# Patient Record
Sex: Female | Born: 1985 | Hispanic: Yes | Marital: Married | Smoking: Never smoker
Health system: Southern US, Community
[De-identification: ages and names within clinical notes are randomized; demographics above are authoritative.]

## PROBLEM LIST (undated history)

## (undated) DIAGNOSIS — D649 Anemia, unspecified: Secondary | ICD-10-CM

## (undated) DIAGNOSIS — O24419 Gestational diabetes mellitus in pregnancy, unspecified control: Secondary | ICD-10-CM

## (undated) HISTORY — PX: KNEE SURGERY: SHX244

## (undated) HISTORY — PX: OTHER SURGICAL HISTORY: SHX169

## (undated) HISTORY — DX: Anemia, unspecified: D64.9

---
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
## 2008-11-20 ENCOUNTER — Ambulatory Visit: Payer: Self-pay | Admitting: Family Medicine

## 2009-06-07 ENCOUNTER — Inpatient Hospital Stay: Payer: Self-pay | Admitting: Obstetrics and Gynecology

## 2009-06-22 IMAGING — US US OB < 14 WEEKS
1 series · 17 of 28 positions shown · non-contrast
Comparison: none

REASON FOR EXAM: CALLREPORT [id number] dates viablity  pt is 59wks 5day by
last LMP
COMMENTS:

[Series 1: us ob < 14 weeks · 17 of 51 slices shown]
[im 1/51]
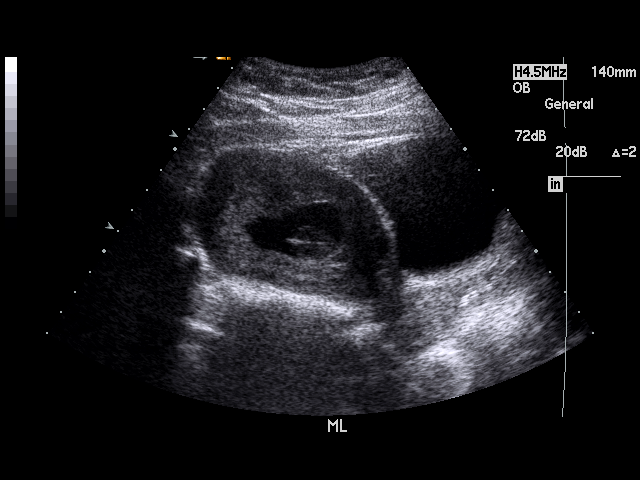
[im 4/51]
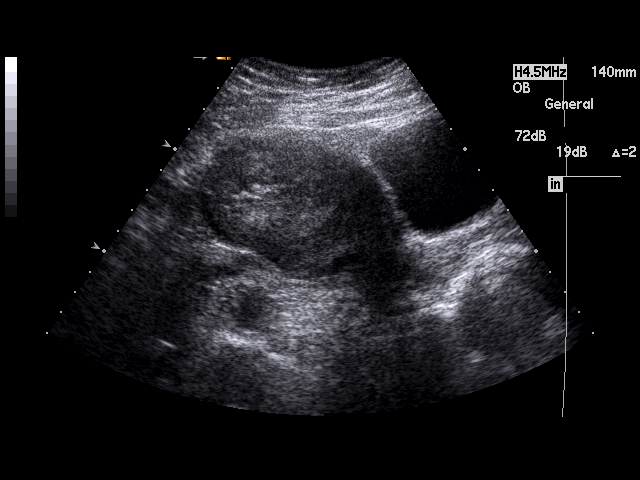
[im 8/51]
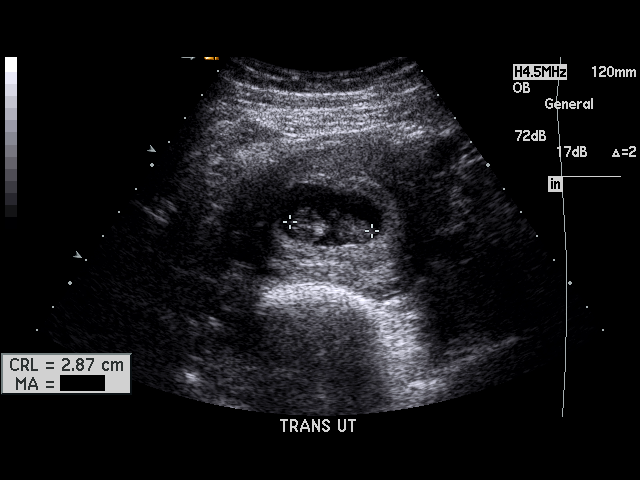
[im 10/51]
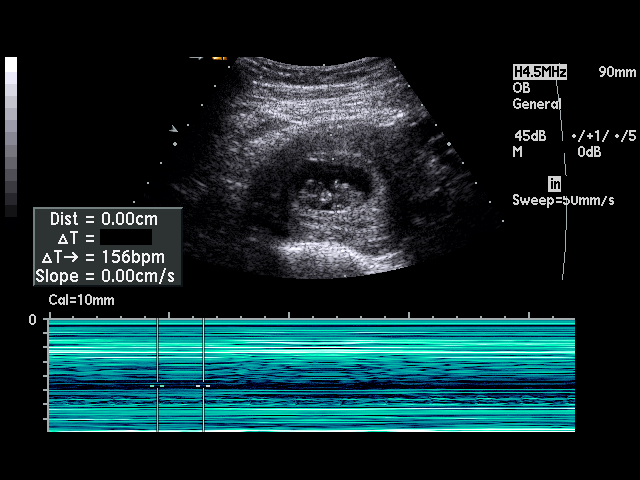
[im 13/51]
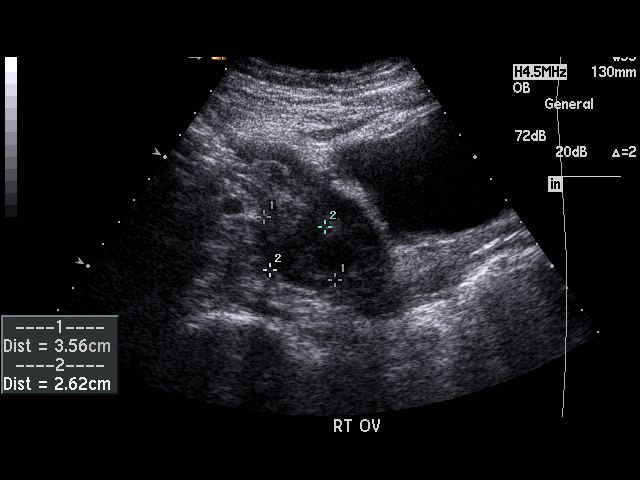
[im 17/51]
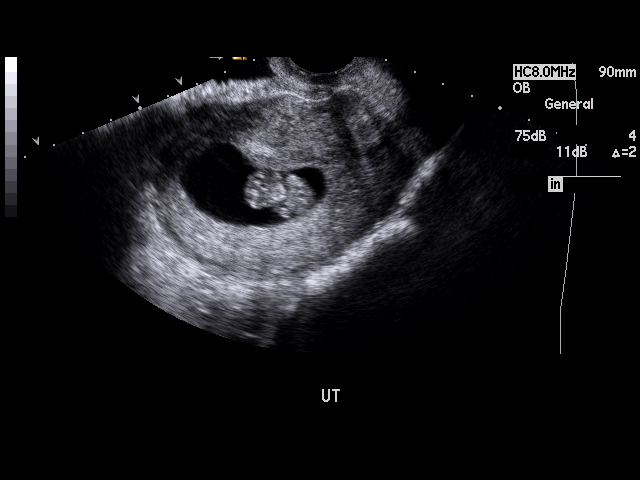
[im 19/51]
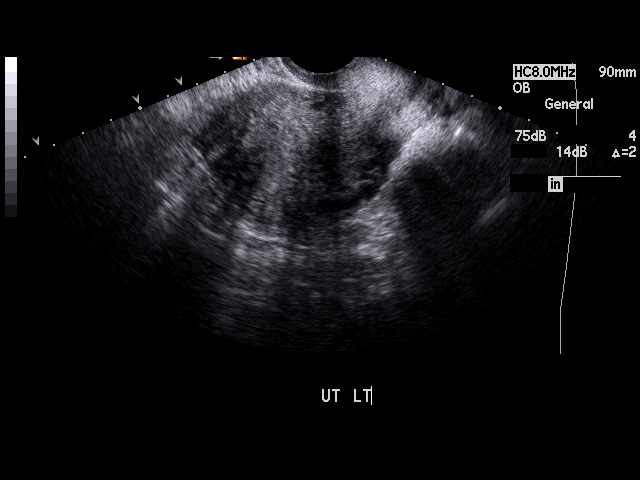
[im 23/51]
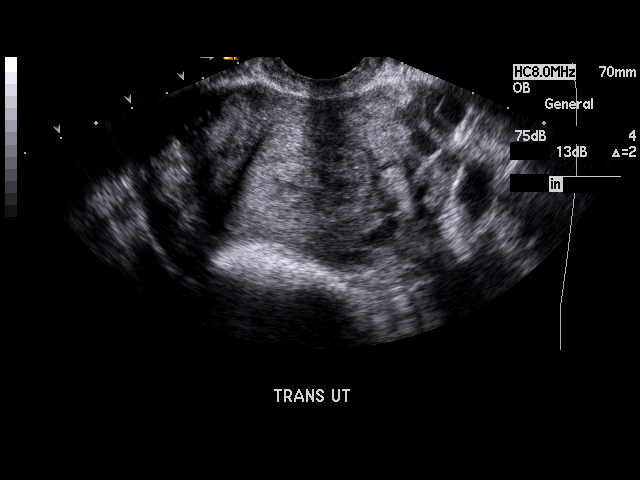
[im 26/51]
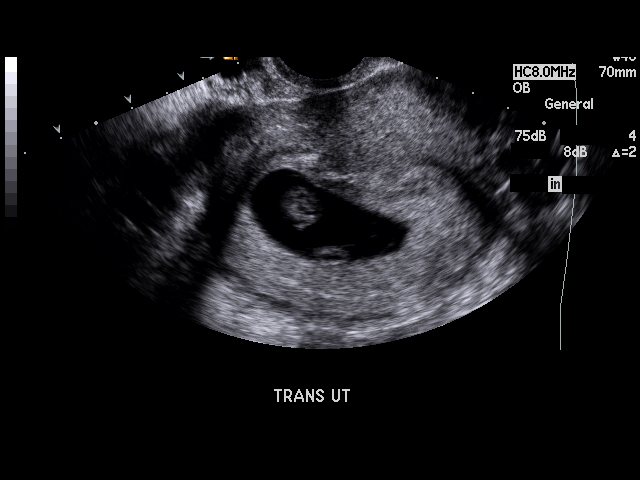
[im 28/51]
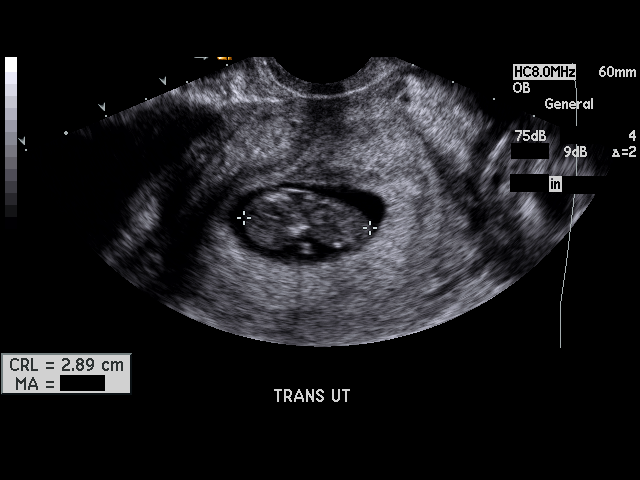
[im 32/51]
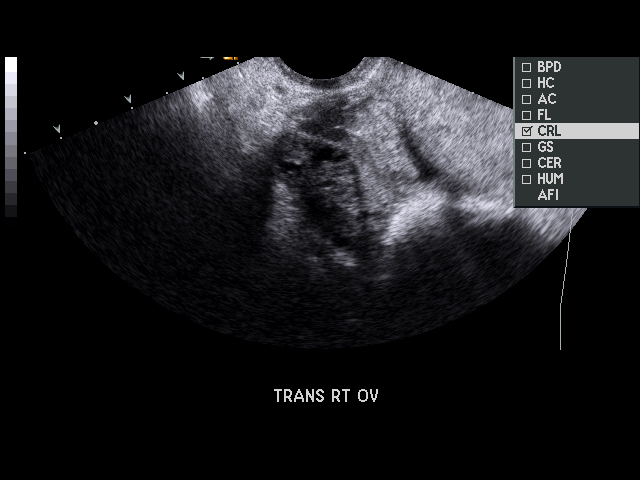
[im 34/51]
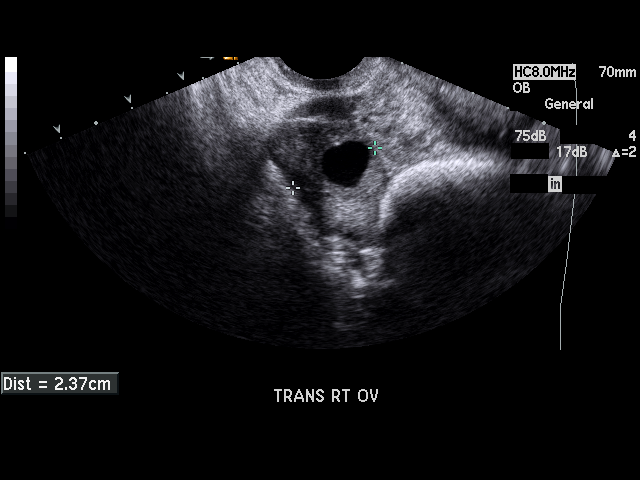
[im 38/51]
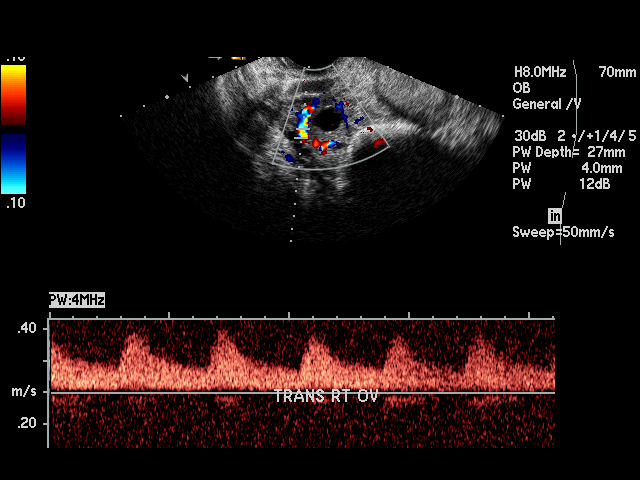
[im 41/51]
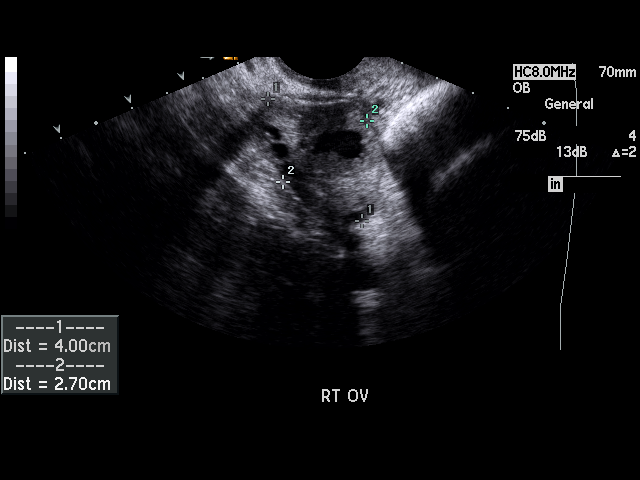
[im 43/51]
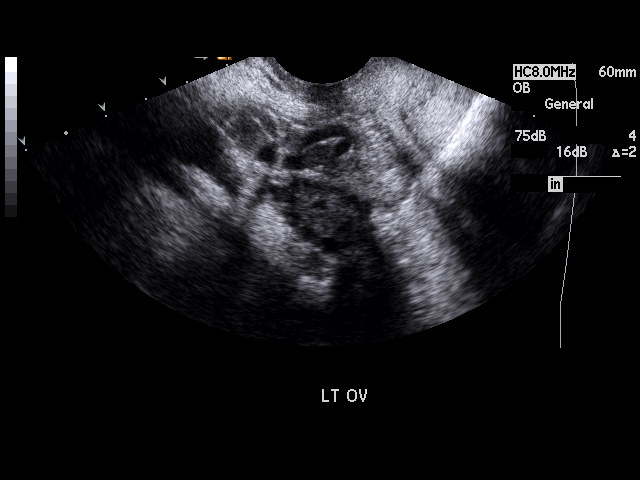
[im 47/51]
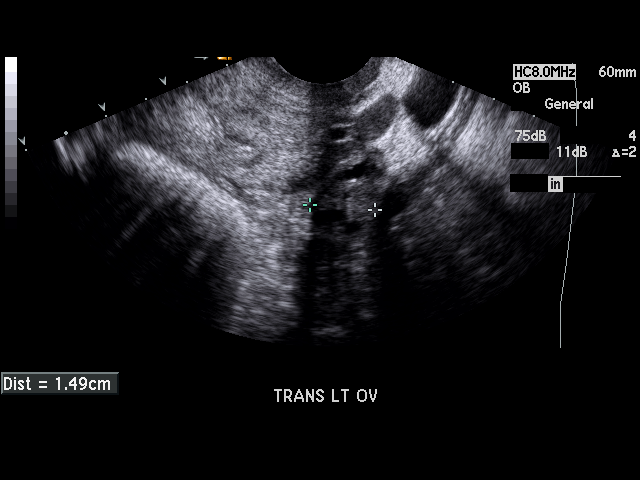
[im 51/51]
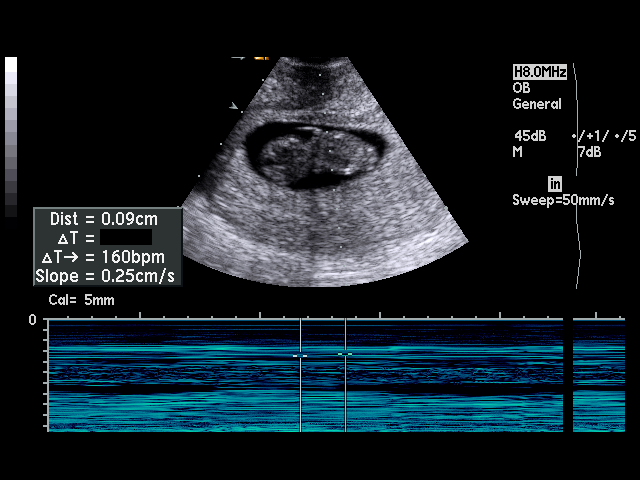

[17 of 28 positions shown; findings below may reference images not displayed]

PROCEDURE:     US  - US OB LESS THAN 14 WEEKS  - November 20, 2008  [DATE]

RESULT:     Early OB protocol ultrasound is performed utilizing
transabdominal and endovaginal scanning. The study demonstrates an
intrauterine gestation. Crown-rump length averages 2.91 cm which is
consistent with a 9 week-0 day gestation. The fetal heart rate is measured
at 156 to 160 beats per minute. The yolk sac is seen and appears
unremarkable. The RIGHT ovary contains a 2.1 x 1.8 x 1.9 cm probable corpus
luteum cyst. The ovaries are otherwise within normal limits. No adnexal
abnormality is seen.
IMPRESSION: Intrauterine fetus averaging approximately 9 weeks-0 day
gestation with an estimated delivery date of 06/20/2009.

## 2011-02-02 ENCOUNTER — Emergency Department: Payer: Self-pay | Admitting: Emergency Medicine

## 2014-01-05 ENCOUNTER — Ambulatory Visit: Payer: Self-pay | Admitting: Advanced Practice Midwife

## 2014-06-01 ENCOUNTER — Observation Stay: Payer: Self-pay

## 2014-06-06 ENCOUNTER — Inpatient Hospital Stay: Payer: Self-pay

## 2014-06-06 LAB — CBC WITH DIFFERENTIAL/PLATELET
BASOS ABS: 0 10*3/uL (ref 0.0–0.1)
Basophil %: 0.3 %
EOS PCT: 0.7 %
Eosinophil #: 0.1 10*3/uL (ref 0.0–0.7)
HCT: 36.2 % (ref 35.0–47.0)
HGB: 11.7 g/dL — ABNORMAL LOW (ref 12.0–16.0)
LYMPHS ABS: 1.6 10*3/uL (ref 1.0–3.6)
Lymphocyte %: 21.5 %
MCH: 27.3 pg (ref 26.0–34.0)
MCHC: 32.3 g/dL (ref 32.0–36.0)
MCV: 84 fL (ref 80–100)
MONOS PCT: 7.9 %
Monocyte #: 0.6 x10 3/mm (ref 0.2–0.9)
Neutrophil #: 5.3 10*3/uL (ref 1.4–6.5)
Neutrophil %: 69.6 %
Platelet: 281 10*3/uL (ref 150–440)
RBC: 4.29 10*6/uL (ref 3.80–5.20)
RDW: 13.9 % (ref 11.5–14.5)
WBC: 7.6 10*3/uL (ref 3.6–11.0)

## 2014-06-07 LAB — HEMOGLOBIN: HGB: 11.1 g/dL — ABNORMAL LOW (ref 12.0–16.0)

## 2015-01-19 NOTE — H&P (Signed)
L&D Evaluation:  History:  HPI 29 yo G3P2 at 39+5 week admitted in active labor   Patient's Medical History No Chronic Illness   Patient's Surgical History none   Medications Pre Natal Vitamins   Allergies NKDA   Social History none   Family History Non-Contributory   ROS:  ROS All systems were reviewed.  HEENT, CNS, GI, GU, Respiratory, CV, Renal and Musculoskeletal systems were found to be normal.   Exam:  Vital Signs stable   Mental Status clear   Chest clear   Heart normal sinus rhythm   Abdomen gravid, non-tender   Estimated Fetal Weight Average for gestational age   Pelvic 7 cm / c +1   Mebranes Ruptured, arom - clear   FHT normal rate with no decels   Fetal Heart Rate 130   Ucx regular   Impression:  Impression active labor   Plan:  Plan antibiotics for GBBS prophylaxis, admit anticipate SVD shortly   Electronic Signatures: Schermerhorn, Ihor Austinhomas J (MD)  (Signed 26-Sep-15 22:07)  Authored: L&D Evaluation   Last Updated: 26-Sep-15 22:07 by Suzy BouchardSchermerhorn, Thomas J (MD)

## 2017-04-19 ENCOUNTER — Other Ambulatory Visit: Payer: Self-pay | Admitting: Physician Assistant

## 2017-04-19 DIAGNOSIS — Z3481 Encounter for supervision of other normal pregnancy, first trimester: Secondary | ICD-10-CM

## 2017-04-20 LAB — OB RESULTS CONSOLE HIV ANTIBODY (ROUTINE TESTING): HIV: NONREACTIVE

## 2017-04-20 LAB — OB RESULTS CONSOLE RPR: RPR: NONREACTIVE

## 2017-04-20 LAB — OB RESULTS CONSOLE HEPATITIS B SURFACE ANTIGEN: HEP B S AG: NEGATIVE

## 2017-04-21 LAB — OB RESULTS CONSOLE GC/CHLAMYDIA
Chlamydia: NEGATIVE
GC PROBE AMP, GENITAL: NEGATIVE

## 2017-04-23 ENCOUNTER — Ambulatory Visit
Admission: RE | Admit: 2017-04-23 | Discharge: 2017-04-23 | Disposition: A | Discharge status: Home / Self Care | Payer: Self-pay | Source: Ambulatory Visit | Attending: Physician Assistant | Admitting: Physician Assistant

## 2017-04-23 DIAGNOSIS — Z3A12 12 weeks gestation of pregnancy: Secondary | ICD-10-CM | POA: Insufficient documentation

## 2017-04-23 DIAGNOSIS — Z3687 Encounter for antenatal screening for uncertain dates: Secondary | ICD-10-CM | POA: Insufficient documentation

## 2017-04-23 DIAGNOSIS — Z3481 Encounter for supervision of other normal pregnancy, first trimester: Secondary | ICD-10-CM

## 2017-05-17 ENCOUNTER — Other Ambulatory Visit: Payer: Self-pay | Admitting: Physician Assistant

## 2017-05-17 DIAGNOSIS — Z3482 Encounter for supervision of other normal pregnancy, second trimester: Secondary | ICD-10-CM

## 2017-06-07 ENCOUNTER — Ambulatory Visit: Payer: MEDICAID

## 2017-06-07 ENCOUNTER — Ambulatory Visit: Admission: RE | Admit: 2017-06-07 | Payer: Self-pay | Source: Ambulatory Visit

## 2017-06-07 ENCOUNTER — Ambulatory Visit
Admission: RE | Admit: 2017-06-07 | Discharge: 2017-06-07 | Disposition: A | Discharge status: Home / Self Care | Payer: Self-pay | Source: Ambulatory Visit | Attending: Physician Assistant | Admitting: Physician Assistant

## 2017-06-07 ENCOUNTER — Ambulatory Visit
Admit: 2017-06-07 | Discharge: 2017-06-07 | Disposition: A | Discharge status: Home / Self Care | Payer: Self-pay | Source: Ambulatory Visit | Attending: Physician Assistant | Admitting: Physician Assistant

## 2017-06-07 DIAGNOSIS — Z3482 Encounter for supervision of other normal pregnancy, second trimester: Secondary | ICD-10-CM | POA: Insufficient documentation

## 2017-06-07 DIAGNOSIS — Z3A18 18 weeks gestation of pregnancy: Secondary | ICD-10-CM | POA: Insufficient documentation

## 2017-08-06 LAB — OB RESULTS CONSOLE HIV ANTIBODY (ROUTINE TESTING): HIV: NONREACTIVE

## 2017-08-07 LAB — OB RESULTS CONSOLE RPR: RPR: NONREACTIVE

## 2017-08-14 ENCOUNTER — Encounter: Payer: Self-pay | Attending: Advanced Practice Midwife | Admitting: Dietician

## 2017-08-14 ENCOUNTER — Encounter: Payer: Self-pay | Admitting: Dietician

## 2017-08-14 DIAGNOSIS — Z3A00 Weeks of gestation of pregnancy not specified: Secondary | ICD-10-CM | POA: Insufficient documentation

## 2017-08-14 DIAGNOSIS — O24919 Unspecified diabetes mellitus in pregnancy, unspecified trimester: Secondary | ICD-10-CM | POA: Insufficient documentation

## 2017-08-14 DIAGNOSIS — Z713 Dietary counseling and surveillance: Secondary | ICD-10-CM | POA: Insufficient documentation

## 2017-08-14 NOTE — Progress Notes (Signed)
Diabetes Self-Management Education  Visit Type: First/Initial  Appt. Start Time:1430 Appt. End Time:1600  08/14/2017  Ms. Nicole EgeKaren Mccormack, identified by name and date of birth, is a 31 y.o. female with a diagnosis of Diabetes: Gestational Diabetes.   ASSESSMENT  Blood pressure 104/70, height 5' (1.524 m), weight 186 lb 6.4 oz (84.6 kg), last menstrual period 01/31/2017. Body mass index is 36.4 kg/m.  Diabetes Self-Management Education - 08/14/17 1616      Visit Information   Visit Type  First/Initial      Initial Visit   Diabetes Type  Gestational Diabetes      Health Coping   How would you rate your overall health?  Fair      Psychosocial Assessment   Patient Belief/Attitude about Diabetes  Motivated to manage diabetes    Self-care barriers  None    Self-management support  Doctor's office;Family    Other persons present  Spouse/SO    Patient Concerns  Glycemic Control;Healthy Lifestyle prevent complications    Special Needs  None    Preferred Learning Style  Visual    Learning Readiness  Ready    What is the last grade level you completed in school?  12      Pre-Education Assessment   Patient understands the diabetes disease and treatment process.  Needs Instruction    Patient understands incorporating nutritional management into lifestyle.  Needs Instruction    Patient undertands incorporating physical activity into lifestyle.  Needs Instruction    Patient understands using medications safely.  Needs Instruction    Patient understands monitoring blood glucose, interpreting and using results  Needs Instruction    Patient understands prevention, detection, and treatment of acute complications.  Needs Instruction    Patient understands prevention, detection, and treatment of chronic complications.  Needs Instruction    Patient understands how to develop strategies to address psychosocial issues.  Needs Instruction    Patient understands how to develop strategies to promote  health/change behavior.  Needs Instruction      Complications   How often do you check your blood sugar?  0 times/day (not testing)    Have you had a dilated eye exam in the past 12 months?  Yes 01-2017    Have you had a dental exam in the past 12 months?  Yes 10-2016    Are you checking your feet?  No      Dietary Intake   Breakfast  eats breakfast at 8a-10a    Snack (morning)  none    Lunch  eats lunch at 4p    Snack (afternoon)  none    Dinner  eats supper at 7p    Snack (evening)  occasionally eats fruit    Beverage(s)  drinks water4-5x/day, sweetened coffee 1x/day + diet soda 4-5x/day      Exercise   Exercise Type  ADL's      Patient Education   Previous Diabetes Education  No    Disease state   Factors that contribute to the development of diabetes;Definition of diabetes, type 1 and 2, and the diagnosis of diabetes;Explored patient's options for treatment of their diabetes    Nutrition management   Role of diet in the treatment of diabetes and the relationship between the three main macronutrients and blood glucose level;Carbohydrate counting;Food label reading, portion sizes and measuring food.    Physical activity and exercise   Helped patient identify appropriate exercises in relation to his/her diabetes, diabetes complications and other health issue.;Role of exercise  on diabetes management, blood pressure control and cardiac health.    Medications  -- discussed medications options for GDM    Monitoring  Taught/evaluated SMBG meter.;Taught/discussed recording of test results and interpretation of SMBG.;Purpose and frequency of SMBG. gave pt Contour Next meter and instructed on its use-BG 64 (ac supper); gave pt glucose tablet x1 and instructed to eat dinner as soon as possible    Acute complications  Taught treatment of hypoglycemia - the 15 rule.;Discussed and identified patients' treatment of hyperglycemia.    Chronic complications  Relationship between chronic complications and  blood glucose control    Psychosocial adjustment  Role of stress on diabetes;Worked with patient to identify barriers to care and solutions;Identified and addressed patients feelings and concerns about diabetes    Preconception care  Pregnancy and GDM  Role of pre-pregnancy blood glucose control on the development of the fetus;Reviewed with patient blood glucose goals with pregnancy;Role of family planning for patients with diabetes    Personal strategies to promote health  Lifestyle issues that need to be addressed for better diabetes care;Helped patient develop diabetes management plan for (enter comment)      Outcomes   Expected Outcomes  Demonstrated interest in learning. Expect positive outcomes       Individualized Plan for Diabetes Self-Management Training:   Learning Objective:  Patient will have a greater understanding of diabetes self-management. Patient education plan is to attend individual and/or group sessions per assessed needs and concerns.   Plan:   There are no Patient Instructions on file for this visit.  Expected Outcomes:  Demonstrated interest in learning. Expect positive outcomes  Education material provided: General meal guidelines for healthy pregnancy, GDM booklet, viewed GDM video, Contour Next meter with 40 test strips, food list handout  If problems or questions, patient to contact team via:  [phone]  Future DSME appointment:  08-22-17 at 1;30p

## 2017-08-22 ENCOUNTER — Ambulatory Visit: Payer: Self-pay | Admitting: Dietician

## 2017-08-28 ENCOUNTER — Encounter: Payer: Self-pay | Admitting: Dietician

## 2017-08-28 NOTE — Progress Notes (Signed)
Patient has not responded to reschedule her cancelled appointment from 08/22/17. Sent discharge letter to referring provider.

## 2017-09-11 NOTE — L&D Delivery Note (Signed)
Delivery Note At 7:43 AM a viable female was delivered via Vaginal, Spontaneous (Presentation:vtx ;  ).  APGAR: 8, 9; weight  .   Placenta status:intact  .  Cord:  with the following complications:precipitous labor and delivery  . Anesthesia: none  Episiotomy:  none Lacerations:  none Suture Repair: n/a Est. Blood Loss (mL): 200cc   Mom to postpartum.  Baby to Couplet care / Skin to Skin.  Ihor Austinhomas J Schermerhorn 10/27/2017, 8:34 AM

## 2017-10-04 ENCOUNTER — Other Ambulatory Visit: Payer: Self-pay | Admitting: Physician Assistant

## 2017-10-04 DIAGNOSIS — Z3483 Encounter for supervision of other normal pregnancy, third trimester: Secondary | ICD-10-CM

## 2017-10-10 ENCOUNTER — Ambulatory Visit
Admission: RE | Admit: 2017-10-10 | Discharge: 2017-10-10 | Disposition: A | Discharge status: Home / Self Care | Payer: Self-pay | Source: Ambulatory Visit | Attending: Physician Assistant | Admitting: Physician Assistant

## 2017-10-10 DIAGNOSIS — Z3A36 36 weeks gestation of pregnancy: Secondary | ICD-10-CM | POA: Insufficient documentation

## 2017-10-10 DIAGNOSIS — O09293 Supervision of pregnancy with other poor reproductive or obstetric history, third trimester: Secondary | ICD-10-CM | POA: Insufficient documentation

## 2017-10-10 DIAGNOSIS — O24419 Gestational diabetes mellitus in pregnancy, unspecified control: Secondary | ICD-10-CM | POA: Insufficient documentation

## 2017-10-10 DIAGNOSIS — Z3483 Encounter for supervision of other normal pregnancy, third trimester: Secondary | ICD-10-CM

## 2017-10-10 DIAGNOSIS — Z3689 Encounter for other specified antenatal screening: Secondary | ICD-10-CM | POA: Insufficient documentation

## 2017-10-11 LAB — OB RESULTS CONSOLE GBS: GBS: POSITIVE

## 2017-10-27 ENCOUNTER — Other Ambulatory Visit: Payer: Self-pay

## 2017-10-27 ENCOUNTER — Inpatient Hospital Stay
Admission: EM | Admit: 2017-10-27 | Discharge: 2017-10-29 | DRG: 807 | Disposition: A | Discharge status: Home / Self Care | Payer: Medicaid Other | Attending: Obstetrics & Gynecology | Admitting: Obstetrics & Gynecology

## 2017-10-27 DIAGNOSIS — O479 False labor, unspecified: Secondary | ICD-10-CM | POA: Diagnosis present

## 2017-10-27 DIAGNOSIS — O99824 Streptococcus B carrier state complicating childbirth: Secondary | ICD-10-CM | POA: Diagnosis present

## 2017-10-27 DIAGNOSIS — Z3483 Encounter for supervision of other normal pregnancy, third trimester: Secondary | ICD-10-CM | POA: Diagnosis present

## 2017-10-27 DIAGNOSIS — Z3A38 38 weeks gestation of pregnancy: Secondary | ICD-10-CM | POA: Diagnosis not present

## 2017-10-27 DIAGNOSIS — O24420 Gestational diabetes mellitus in childbirth, diet controlled: Principal | ICD-10-CM | POA: Diagnosis present

## 2017-10-27 HISTORY — DX: Gestational diabetes mellitus in pregnancy, unspecified control: O24.419

## 2017-10-27 LAB — CBC
HCT: 34.2 % — ABNORMAL LOW (ref 35.0–47.0)
HEMOGLOBIN: 11.5 g/dL — AB (ref 12.0–16.0)
MCH: 27.7 pg (ref 26.0–34.0)
MCHC: 33.7 g/dL (ref 32.0–36.0)
MCV: 82.4 fL (ref 80.0–100.0)
Platelets: 357 10*3/uL (ref 150–440)
RBC: 4.15 MIL/uL (ref 3.80–5.20)
RDW: 14.3 % (ref 11.5–14.5)
WBC: 8.7 10*3/uL (ref 3.6–11.0)

## 2017-10-27 LAB — TYPE AND SCREEN
ABO/RH(D): B POS
ANTIBODY SCREEN: NEGATIVE

## 2017-10-27 MED ORDER — BENZOCAINE-MENTHOL 20-0.5 % EX AERO
INHALATION_SPRAY | CUTANEOUS | Status: AC
  Administered 2017-10-27: 1 via TOPICAL
  Filled 2017-10-27: qty 56

## 2017-10-27 MED ORDER — PRENATAL MULTIVITAMIN CH
1.0000 | ORAL_TABLET | Freq: Every day | ORAL | Status: DC
  Administered 2017-10-27 – 2017-10-29 (×3): 1 via ORAL
  Filled 2017-10-27 (×3): qty 1

## 2017-10-27 MED ORDER — LACTATED RINGERS IV SOLN: 500.0000 mL | INTRAVENOUS | Status: DC | PRN

## 2017-10-27 MED ORDER — COCONUT OIL OIL
1.0000 "application " | TOPICAL_OIL | Status: DC | PRN
  Filled 2017-10-27: qty 120

## 2017-10-27 MED ORDER — ACETAMINOPHEN 325 MG PO TABS
650.0000 mg | ORAL_TABLET | ORAL | Status: DC | PRN
Start: 2017-10-27 — End: 2017-10-27
  Administered 2017-10-27 (×2): 650 mg via ORAL
  Filled 2017-10-27: qty 2

## 2017-10-27 MED ORDER — OXYTOCIN 40 UNITS IN LACTATED RINGERS INFUSION - SIMPLE MED
2.5000 [IU]/h | INTRAVENOUS | Status: DC
  Administered 2017-10-27: 2.5 [IU]/h via INTRAVENOUS

## 2017-10-27 MED ORDER — MEASLES, MUMPS & RUBELLA VAC ~~LOC~~ INJ
0.5000 mL | INJECTION | Freq: Once | SUBCUTANEOUS | Status: DC
  Filled 2017-10-27: qty 0.5

## 2017-10-27 MED ORDER — IBUPROFEN 600 MG PO TABS
600.0000 mg | ORAL_TABLET | Freq: Four times a day (QID) | ORAL | Status: DC
  Administered 2017-10-27: 600 mg via ORAL

## 2017-10-27 MED ORDER — IBUPROFEN 600 MG PO TABS
600.0000 mg | ORAL_TABLET | Freq: Four times a day (QID) | ORAL | Status: DC
  Administered 2017-10-27 – 2017-10-29 (×8): 600 mg via ORAL
  Filled 2017-10-27 (×8): qty 1

## 2017-10-27 MED ORDER — ACETAMINOPHEN 325 MG PO TABS
650.0000 mg | ORAL_TABLET | ORAL | Status: DC | PRN
  Filled 2017-10-27: qty 2

## 2017-10-27 MED ORDER — OXYTOCIN BOLUS FROM INFUSION
500.0000 mL | Freq: Once | INTRAVENOUS | Status: AC
  Administered 2017-10-27: 500 mL via INTRAVENOUS

## 2017-10-27 MED ORDER — SOD CITRATE-CITRIC ACID 500-334 MG/5ML PO SOLN
30.0000 mL | ORAL | Status: DC | PRN
Start: 2017-10-27 — End: 2017-10-27

## 2017-10-27 MED ORDER — MISOPROSTOL 200 MCG PO TABS
ORAL_TABLET | ORAL | Status: AC
  Filled 2017-10-27: qty 4

## 2017-10-27 MED ORDER — METHYLERGONOVINE MALEATE 0.2 MG/ML IJ SOLN
INTRAMUSCULAR | Status: AC
  Filled 2017-10-27: qty 1

## 2017-10-27 MED ORDER — MAGNESIUM HYDROXIDE 400 MG/5ML PO SUSP: 30.0000 mL | ORAL | Status: DC | PRN

## 2017-10-27 MED ORDER — ONDANSETRON HCL 4 MG PO TABS: 4.0000 mg | ORAL_TABLET | ORAL | Status: DC | PRN

## 2017-10-27 MED ORDER — SENNOSIDES-DOCUSATE SODIUM 8.6-50 MG PO TABS
2.0000 | ORAL_TABLET | ORAL | Status: DC
  Administered 2017-10-28 – 2017-10-29 (×2): 2 via ORAL
  Filled 2017-10-27 (×2): qty 2

## 2017-10-27 MED ORDER — ONDANSETRON HCL 4 MG/2ML IJ SOLN: 4.0000 mg | INTRAMUSCULAR | Status: DC | PRN

## 2017-10-27 MED ORDER — IBUPROFEN 600 MG PO TABS
ORAL_TABLET | ORAL | Status: AC
  Filled 2017-10-27: qty 1

## 2017-10-27 MED ORDER — OXYTOCIN 10 UNIT/ML IJ SOLN
INTRAMUSCULAR | Status: AC
  Filled 2017-10-27: qty 2

## 2017-10-27 MED ORDER — ZOLPIDEM TARTRATE 5 MG PO TABS: 5.0000 mg | ORAL_TABLET | Freq: Every evening | ORAL | Status: DC | PRN

## 2017-10-27 MED ORDER — LACTATED RINGERS IV SOLN
INTRAVENOUS | Status: DC
  Administered 2017-10-27: 08:00:00 via INTRAVENOUS

## 2017-10-27 MED ORDER — LIDOCAINE HCL (PF) 1 % IJ SOLN: 30.0000 mL | INTRAMUSCULAR | Status: DC | PRN

## 2017-10-27 MED ORDER — DIPHENHYDRAMINE HCL 25 MG PO CAPS: 25.0000 mg | ORAL_CAPSULE | Freq: Four times a day (QID) | ORAL | Status: DC | PRN

## 2017-10-27 MED ORDER — BENZOCAINE-MENTHOL 20-0.5 % EX AERO
1.0000 "application " | INHALATION_SPRAY | CUTANEOUS | Status: DC | PRN
  Administered 2017-10-27: 1 via TOPICAL

## 2017-10-27 MED ORDER — AMMONIA AROMATIC IN INHA
RESPIRATORY_TRACT | Status: AC
  Filled 2017-10-27: qty 10

## 2017-10-27 MED ORDER — SIMETHICONE 80 MG PO CHEW: 80.0000 mg | CHEWABLE_TABLET | ORAL | Status: DC | PRN

## 2017-10-27 MED ORDER — LIDOCAINE HCL (PF) 1 % IJ SOLN
INTRAMUSCULAR | Status: AC
  Filled 2017-10-27: qty 30

## 2017-10-27 MED ORDER — WITCH HAZEL-GLYCERIN EX PADS: 1.0000 "application " | MEDICATED_PAD | CUTANEOUS | Status: DC | PRN

## 2017-10-27 MED ORDER — OXYTOCIN 40 UNITS IN LACTATED RINGERS INFUSION - SIMPLE MED
INTRAVENOUS | Status: AC
  Filled 2017-10-27: qty 1000

## 2017-10-27 MED ORDER — FERROUS SULFATE 325 (65 FE) MG PO TABS
325.0000 mg | ORAL_TABLET | Freq: Two times a day (BID) | ORAL | Status: DC
  Administered 2017-10-27 – 2017-10-29 (×4): 325 mg via ORAL
  Filled 2017-10-27 (×4): qty 1

## 2017-10-27 MED ORDER — DIBUCAINE 1 % RE OINT: 1.0000 "application " | TOPICAL_OINTMENT | RECTAL | Status: DC | PRN

## 2017-10-27 NOTE — Discharge Summary (Signed)
Obstetric Discharge Summary   Patient ID: Patient Name: Nicole Livingston DOB: [birth year] MRN: [id number]  Date of Admission: 10/27/2017 Date of Delivery: 10/29/2017 (manually enter date) Delivered NF:[id number], MD Date of Discharge: 10/29/17 Primary OB:  [facility]  [id number] last menstrual period was 01/31/2017. EDC Estimated Date of Delivery: 11/04/17 Gestational Age at Delivery: [redacted]w[redacted]d   Antepartum complications:A1GDM Admitting Diagnosis: 38+6 week rapidly advancing cervix  Secondary Diagnoses: Patient Active Problem List   Diagnosis Date Noted  . Uterine contractions during pregnancy 10/27/2017  . Labor and delivery indication for care or intervention 10/27/2017    Augmentation: None Complications: None Intrapartum complications/course:  Delivery Type: spontaneous vaginal delivery, precipitous labor Anesthesia: none Placenta: sponatneous Laceration: none Episiotomy: none  Newborn Data: Live born female  Birth Weight:   APGAR: 8, 9  Newborn Delivery   Birth date/time:  10/27/2017 07:43:00 Delivery type:  Vaginal, Spontaneous         Postpartum Course  Patient had an uncomplicated postpartum course.  By time of discharge on PPD#2, her pain was controlled on oral pain medications; she had appropriate lochia and was ambulating, voiding without difficulty and tolerating regular diet.  She was deemed stable for discharge to home.       Labs: CBC Latest Ref Rng & Units 10/28/2017 10/27/2017 06/07/2014  WBC 3.6 - 11.0 K/uL 7.5 8.7 -  Hemoglobin 12.0 - 16.0 g/dL 10.9(L) 11.5(L) 11.1(L)  Hematocrit 35.0 - 47.0 % 32.1(L) 34.2(L) -  Platelets 150 - 440 K/uL 328 357 -   B POS  Physical exam:  BP 111/65 (BP Location: Left Arm)   Pulse (!) 58   Temp 97.7 F (36.5 C)   Resp 18   Ht 4\' 9"  (1.448 m)   Wt 85.3 kg (188 lb)   LMP 01/31/2017   SpO2 98%   Breastfeeding? Unknown   BMI 40.68 kg/m  General: alert and no distress Pulm: normal respiratory effort Lochia:  appropriate Abdomen: soft, NT Uterine Fundus: firm, below umbilicus  Extremities: No evidence of DVT seen on physical exam. No lower extremity edema.   Disposition: stable, discharge to home Baby Feeding: breastmilk+formula Baby Disposition: home with mom  Contraception: undecided   Prenatal Labs:     Plan:ABO, Rh:  B+ Antibody:  neg  Rubella:  Imm , Var Imm RPR:   neg HBsAg:   neg HIV:   neg GBS:   POSITIVE( untreated)   Nicole Livingston was discharged to home in good condition. Follow-up appointment at [facility] with delivery provider in 6 weeks  Discharge Instructions: Per After Visit Summary. Activity: Advance as tolerated. Pelvic rest for 6 weeks.   Diet: Regular Discharge Medications: Allergies as of 10/29/2017   No Known Allergies     Medication List    STOP taking these medications   MYLANTA PO     TAKE these medications   ibuprofen 600 MG tablet Commonly known as:  ADVIL,MOTRIN Take 1 tablet (600 mg total) by mouth every 6 (six) hours.   PRENATAL VITAMIN PO Take 1 tablet by mouth daily.   ranitidine 150 MG tablet Commonly known as:  ZANTAC Take 150 mg by mouth 2 (two) times daily.      Outpatient follow up:  Follow-up Information    Vali Capano, Ihor Austin, MD Follow up in 6 week(s).   Specialty:  Obstetrics and Gynecology Why:  pp exam   Contact information: [address] [place] [address] [phone]        Schrum,  Virgel GessMaribeth Horgan, MD .   Specialty:  Hematology and Oncology Contact information: [address] [place] [address] [phone]            Signed:  Ihor Austinhomas J Patrich Heinze

## 2017-10-27 NOTE — OB Triage Note (Signed)
Patient came in c/o of contractions that begin Wednesday (10/24/17). Over night contractions have been more intense and every 3-4 minutes apart. Reports loosing mucous plug. Denies vaginal bleeding. Decrease fetal movement. Patient rating pain 9 out of 10. Monitors placed and assessing.

## 2017-10-27 NOTE — H&P (Signed)
Gwendalyn EgeKaren Beery is a 32 y.o. female at 38+6 weeks  [facility] 12 week [facility]/s presenting for active labor and rapidly advancing cervical dilation . G4P3 with a h/o of A1GDM . A h/o prior shoulder dystocia  No past medical history on file.  PMHX : gestational diabetes Family History: no significant  Social History:  reports that  has never smoked. she has never used smokeless tobacco. She reports that she does not drink alcohol or use drugs.     Maternal Diabetes: Yes:  Diabetes Type:  Diet controlled Genetic Screening: Normal Maternal Ultrasounds/Referrals: Normal Fetal Ultrasounds or other Referrals:  None Maternal Substance Abuse:  No Significant Maternal Medications:  None Significant Maternal Lab Results:  None Other Comments:  None  ROS History Dilation: 4.5 Effacement (%): 80 Station: -1 Exam by:: H.Cahoon RN Blood pressure 109/74, pulse 71, temperature 98.2 F (36.8 C), temperature source Oral, resp. rate 16, height 4\' 9"  (1.448 m), weight 85.3 kg (188 lb), last menstrual period 01/31/2017, unknown if currently breastfeeding. Exam Physical Exam  Lungs CTA  CV RRR  adb gravid  Pelvic TJS : c/c +2  VTX  Prenatal labs: ABO, Rh:  B+ Antibody:  neg  Rubella:  Imm , Var Imm RPR:   neg HBsAg:   neg HIV:   neg GBS:   POSITIVE  Assessment/Plan: Precipitous labor moving from 4cm ---> complete within minutes .  Untreated GBS, notify peds    Ihor Austinhomas J Schermerhorn 10/27/2017, 8:20 AM

## 2017-10-28 LAB — CBC
HCT: 32.1 % — ABNORMAL LOW (ref 35.0–47.0)
HEMOGLOBIN: 10.9 g/dL — AB (ref 12.0–16.0)
MCH: 27.9 pg (ref 26.0–34.0)
MCHC: 34.1 g/dL (ref 32.0–36.0)
MCV: 82 fL (ref 80.0–100.0)
Platelets: 328 10*3/uL (ref 150–440)
RBC: 3.91 MIL/uL (ref 3.80–5.20)
RDW: 14.5 % (ref 11.5–14.5)
WBC: 7.5 10*3/uL (ref 3.6–11.0)

## 2017-10-28 NOTE — Progress Notes (Signed)
Post Partum Day 1 Subjective: no complaints  Objective: Blood pressure 112/68, pulse 62, temperature 97.7 F (36.5 C), temperature source Oral, resp. rate 18, height 4\' 9"  (1.448 m), weight 85.3 kg (188 lb), last menstrual period 01/31/2017, SpO2 99 %, unknown if currently breastfeeding.  Physical Exam:  General: alert and cooperative Lochia: appropriate Uterine Fundus: firm DVT Evaluation: No evidence of DVT seen on physical exam.  Recent Labs    10/27/17 0921 10/28/17 0520  HGB 11.5* 10.9*  HCT 34.2* 32.1*    Assessment/Plan: Plan for discharge tomorrow Precipitous delivery - GBS positive, not treated. Monitor baby per protocol - No maternal concerns   LOS: 1 day   Christeen DouglasBethany Scherrie Seneca 10/28/2017, 1:04 PM

## 2017-10-29 LAB — RPR: RPR: NONREACTIVE

## 2017-10-29 MED ORDER — IBUPROFEN 600 MG PO TABS: 600.0000 mg | ORAL_TABLET | Freq: Four times a day (QID) | ORAL | 0 refills | Status: DC

## 2017-10-29 NOTE — Progress Notes (Signed)
Both parents viewed the DVD "The Period of Purple Cry" prior to discharge home.

## 2017-10-29 NOTE — Discharge Instructions (Signed)
Vaginal Delivery, Care After °Refer to this sheet in the next few weeks. These instructions provide you with information about caring for yourself after vaginal delivery. Your health care provider may also give you more specific instructions. Your treatment has been planned according to current medical practices, but problems sometimes occur. Call your health care provider if you have any problems or questions. °What can I expect after the procedure? °After vaginal delivery, it is common to have: °· Some bleeding from your vagina. °· Soreness in your abdomen, your vagina, and the area of skin between your vaginal opening and your anus (perineum). °· Pelvic cramps. °· Fatigue. ° °Follow these instructions at home: °Medicines °· Take over-the-counter and prescription medicines only as told by your health care provider. °· If you were prescribed an antibiotic medicine, take it as told by your health care provider. Do not stop taking the antibiotic until it is finished. °Driving ° °· Do not drive or operate heavy machinery while taking prescription pain medicine. °· Do not drive for 24 hours if you received a sedative. °Lifestyle °· Do not drink alcohol. This is especially important if you are breastfeeding or taking medicine to relieve pain. °· Do not use tobacco products, including cigarettes, chewing tobacco, or e-cigarettes. If you need help quitting, ask your health care provider. °Eating and drinking °· Drink at least 8 eight-ounce glasses of water every day unless you are told not to by your health care provider. If you choose to breastfeed your baby, you may need to drink more water than this. °· Eat high-fiber foods every day. These foods may help prevent or relieve constipation. High-fiber foods include: °? Whole grain cereals and breads. °? Brown rice. °? Beans. °? Fresh fruits and vegetables. °Activity °· Return to your normal activities as told by your health care provider. Ask your health care provider  what activities are safe for you. °· Rest as much as possible. Try to rest or take a nap when your baby is sleeping. °· Do not lift anything that is heavier than your baby or 10 lb (4.5 kg) until your health care provider says that it is safe. °· Talk with your health care provider about when you can engage in sexual activity. This may depend on your: °? Risk of infection. °? Rate of healing. °? Comfort and desire to engage in sexual activity. °Vaginal Care °· If you have an episiotomy or a vaginal tear, check the area every day for signs of infection. Check for: °? More redness, swelling, or pain. °? More fluid or blood. °? Warmth. °? Pus or a bad smell. °· Do not use tampons or douches until your health care provider says this is safe. °· Watch for any blood clots that may pass from your vagina. These may look like clumps of dark red, brown, or black discharge. °General instructions °· Keep your perineum clean and dry as told by your health care provider. °· Wear loose, comfortable clothing. °· Wipe from front to back when you use the toilet. °· Ask your health care provider if you can shower or take a bath. If you had an episiotomy or a perineal tear during labor and delivery, your health care provider may tell you not to take baths for a certain length of time. °· Wear a bra that supports your breasts and fits you well. °· If possible, have someone help you with household activities and help care for your baby for at least a few days after   you leave the hospital. °· Keep all follow-up visits for you and your baby as told by your health care provider. This is important. °Contact a health care provider if: °· You have: °? Vaginal discharge that has a bad smell. °? Difficulty urinating. °? Pain when urinating. °? A sudden increase or decrease in the frequency of your bowel movements. °? More redness, swelling, or pain around your episiotomy or vaginal tear. °? More fluid or blood coming from your episiotomy or  vaginal tear. °? Pus or a bad smell coming from your episiotomy or vaginal tear. °? A fever. °? A rash. °? Little or no interest in activities you used to enjoy. °? Questions about caring for yourself or your baby. °· Your episiotomy or vaginal tear feels warm to the touch. °· Your episiotomy or vaginal tear is separating or does not appear to be healing. °· Your breasts are painful, hard, or turn red. °· You feel unusually sad or worried. °· You feel nauseous or you vomit. °· You pass large blood clots from your vagina. If you pass a blood clot from your vagina, save it to show to your health care provider. Do not flush blood clots down the toilet without having your health care provider look at them. °· You urinate more than usual. °· You are dizzy or light-headed. °· You have not breastfed at all and you have not had a menstrual period for 12 weeks after delivery. °· You have stopped breastfeeding and you have not had a menstrual period for 12 weeks after you stopped breastfeeding. °Get help right away if: °· You have: °? Pain that does not go away or does not get better with medicine. °? Chest pain. °? Difficulty breathing. °? Blurred vision or spots in your vision. °? Thoughts about hurting yourself or your baby. °· You develop pain in your abdomen or in one of your legs. °· You develop a severe headache. °· You faint. °· You bleed from your vagina so much that you fill two sanitary pads in one hour. °This information is not intended to replace advice given to you by your health care provider. Make sure you discuss any questions you have with your health care provider. °Document Released: 08/25/2000 Document Revised: 02/09/2016 Document Reviewed: 09/12/2015 °Elsevier Interactive Patient Education © 2018 [employer] ° °

## 2017-10-29 NOTE — Progress Notes (Signed)
Pt discharged home with baby via wheelchair by auxilary staff and understand all.

## 2018-01-21 IMAGING — US US OB COMP +14 WK
1 series · 14 of 17 positions shown · non-contrast
Comparison: none

CLINICAL DATA: Second trimester pregnancy.

EXAM:
OBSTETRICAL ULTRASOUND >14 WKS

[Series 1: us ob comp +14 wk · 0.23mm/px · 14 of 17 slices shown]
[im 1/17]
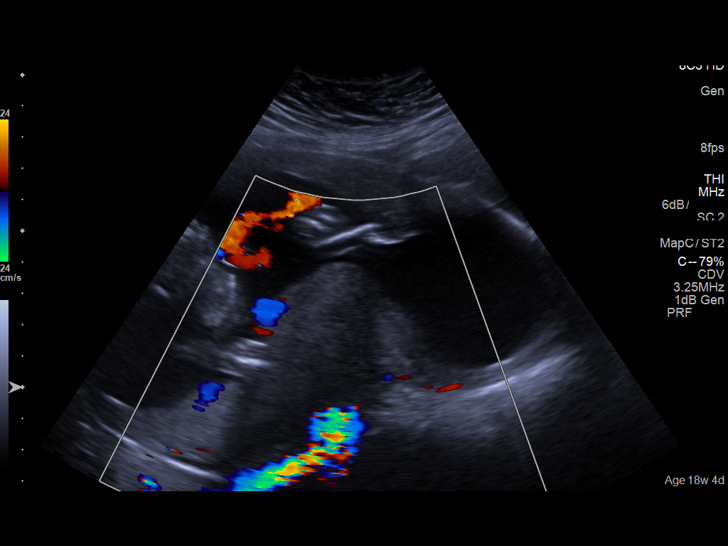
[im 2/17]
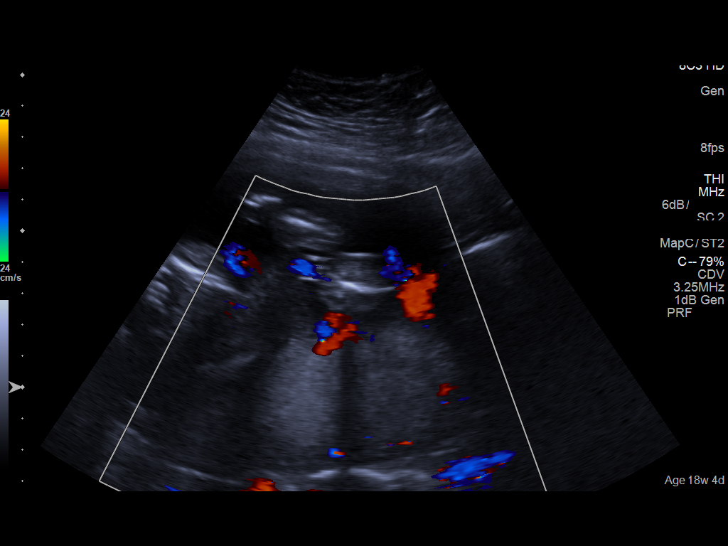
[im 4/17]
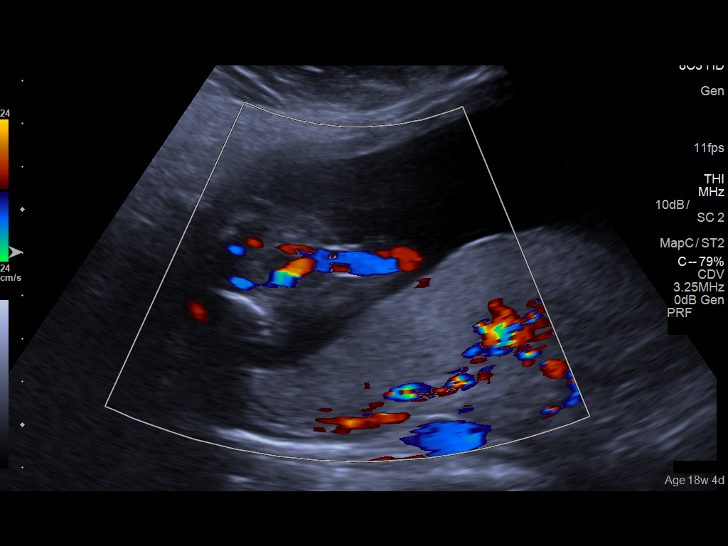
[im 5/17]
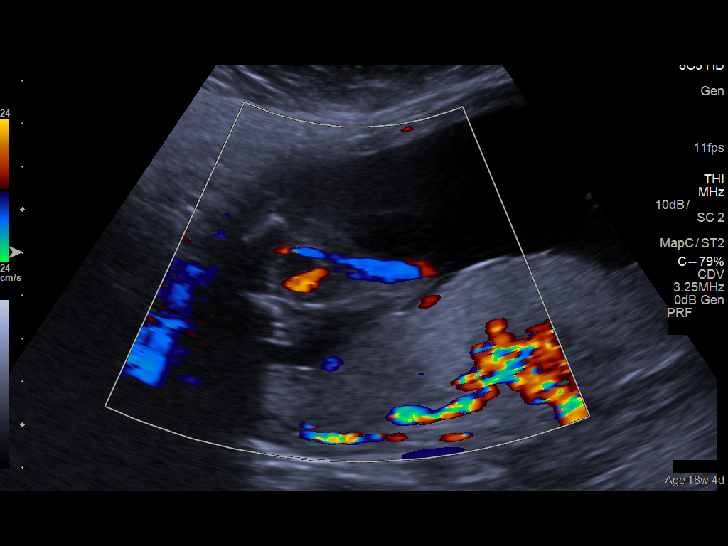
[im 6/17]
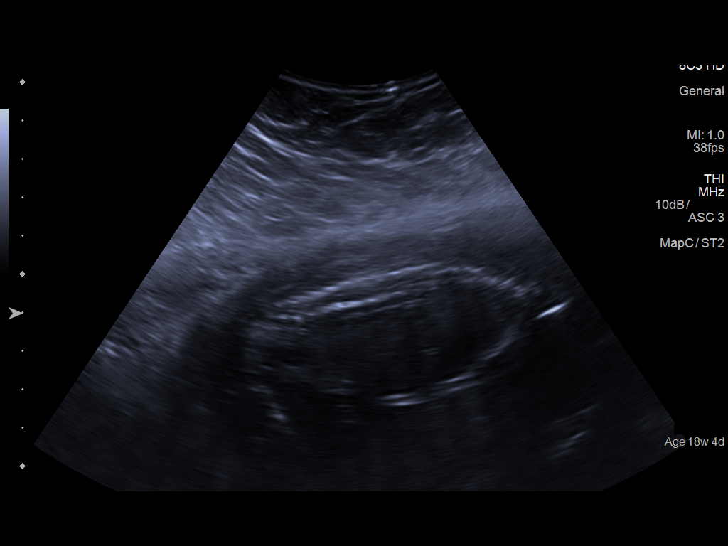
[im 7/17]
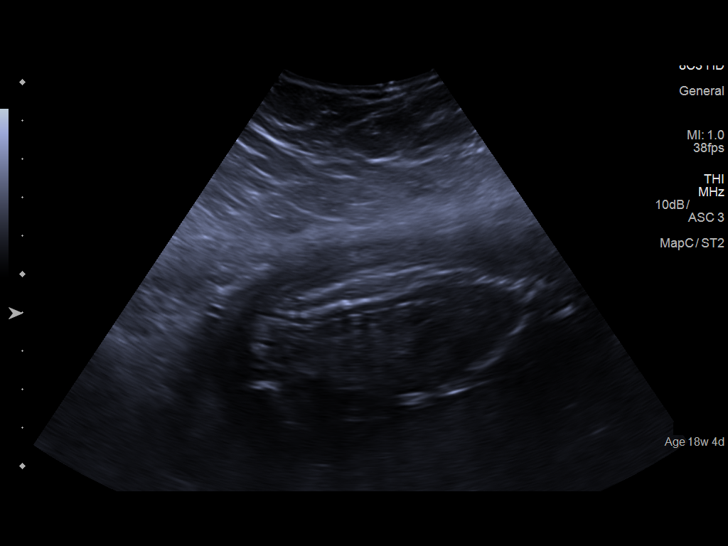
[im 8/17]
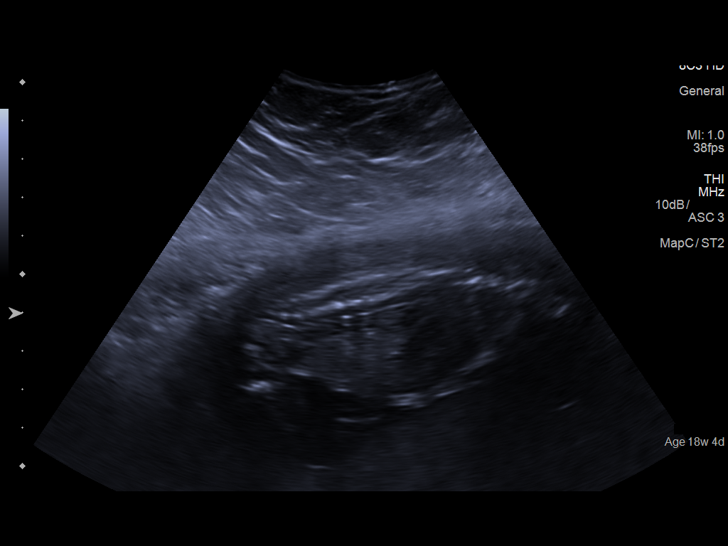
[im 10/17]
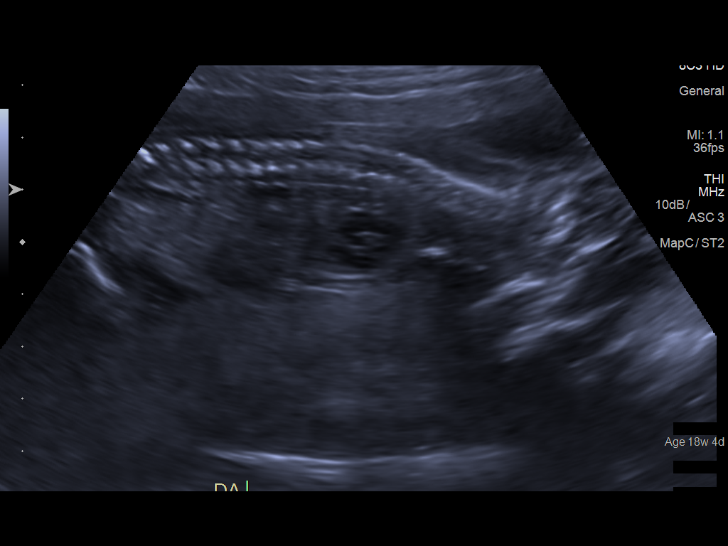
[im 11/17]
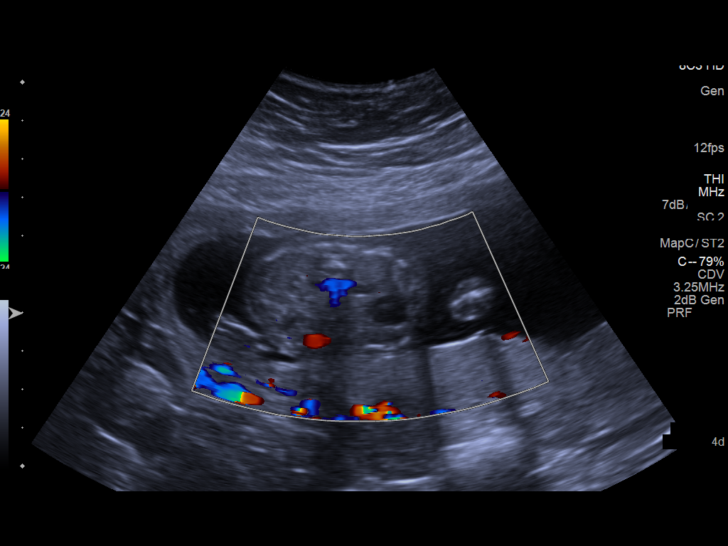
[im 12/17]
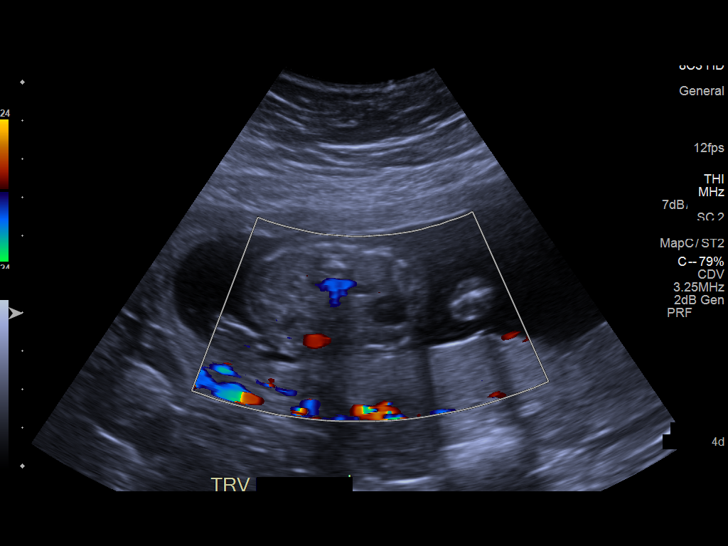
[im 13/17]
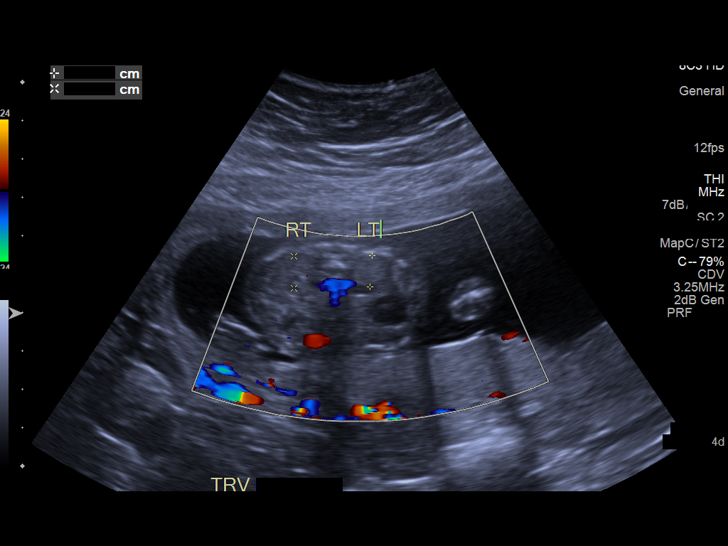
[im 14/17]
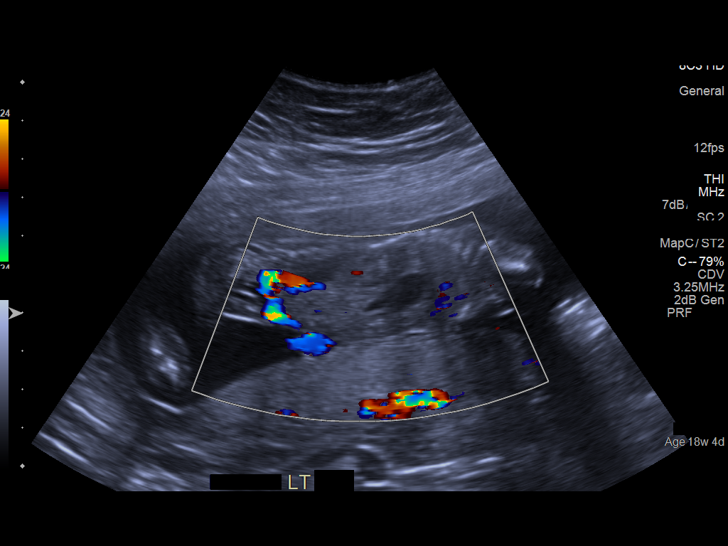
[im 16/17]
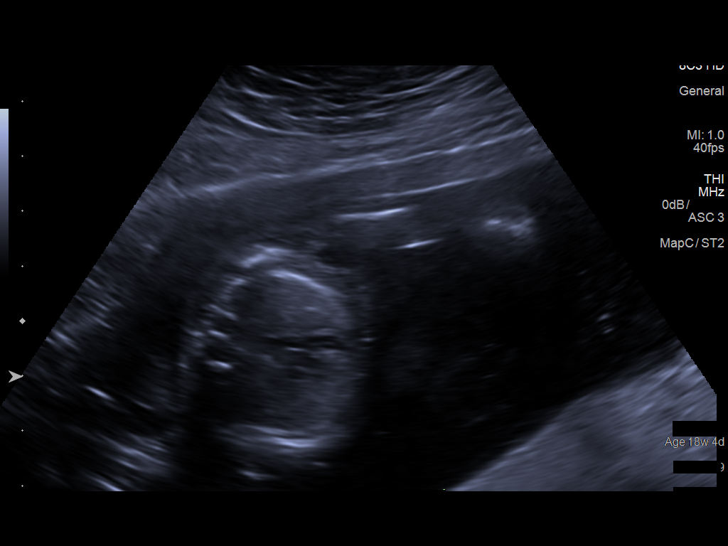
[im 17/17]
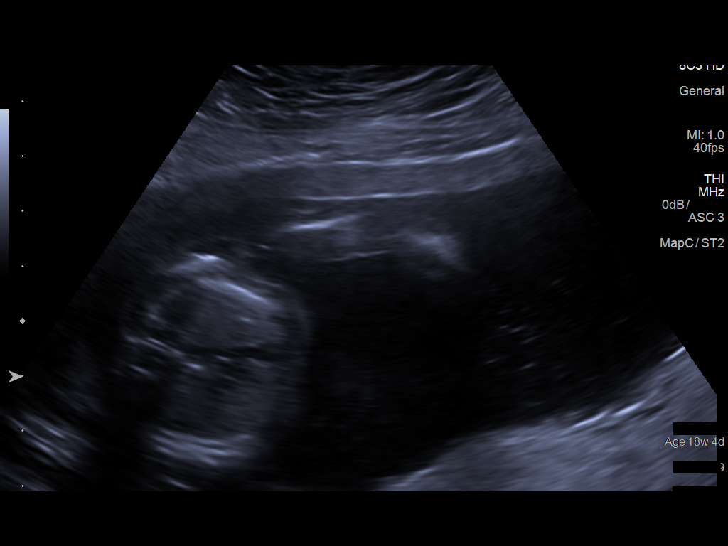

[14 of 17 positions shown; findings below may reference images not displayed]

FINDINGS: Number of Fetuses: 1

Heart Rate:  150 bpm

Movement: Present

Presentation: Cephalic

Previa: No

Placental Location: Posterior

Amniotic Fluid (Subjective): Normal

Amniotic Fluid (Objective):

Vertical pocket 4.9cm

FETAL BIOMETRY

BPD:  4.1cm 18w 3d

HC:    15.2cm  18w   2d

AC:   13.1cm  18w   4d

FL:   3.3cm  20w   1d

Current Mean GA: 18w 5d              US EDC: [DATE]/ 8581.

FETAL ANATOMY

Lateral Ventricles: Visualized

Thalami/CSP: Visualized

Posterior Fossa:  Visualized

Nuchal Region: Visualized

Upper Lip: Visualized

Spine: Limited visualization

4 Chamber Heart on Left: Visualized

LVOT: Visualized

RVOT: Visualized

Stomach on Left: Visualized

3 Vessel Cord: Visualized

Cord Insertion site: Visualized

Kidneys: Visualized

Bladder: Visualized

Extremities: Visualized

Technically difficult due to: Fetal position

Maternal Findings:

Cervix:  5.1 cm and closed
IMPRESSION: Single viable intrauterine pregnancy at 18 weeks 5 days. Variable
presentation.

## 2018-05-26 IMAGING — US US OB FOLLOW-UP
1 series · 13 of 28 positions shown · non-contrast
Comparison: none

CLINICAL DATA: Gestational diabetes. Prior history of fetal
macrosomia. Evaluate fetal growth. Current assigned gestational age
of 36 weeks 3 days.

EXAM:
OBSTETRIC 14+ WK ULTRASOUND FOLLOW-UP

[Series 1: us ob follow-up · 0.31mm/px · 13 of 59 slices shown]
[im 3/59]
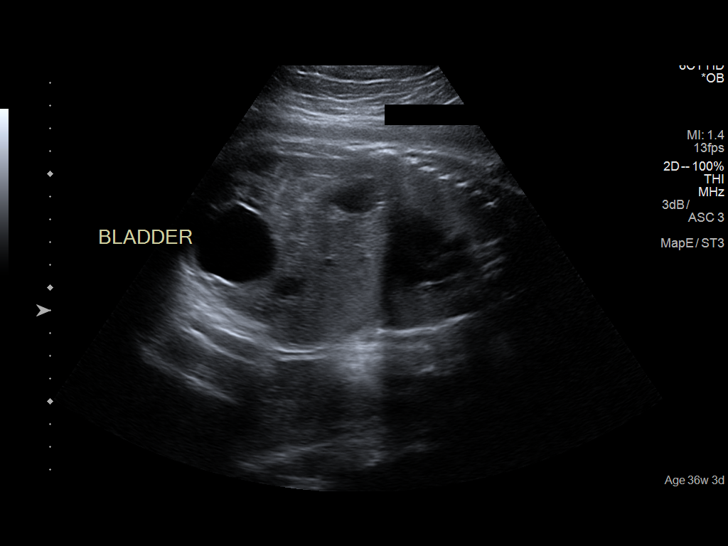
[im 7/59]
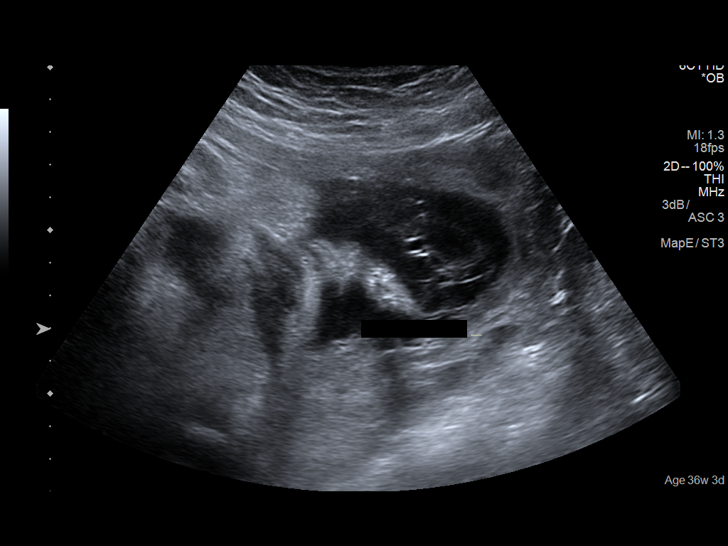
[im 11/59]
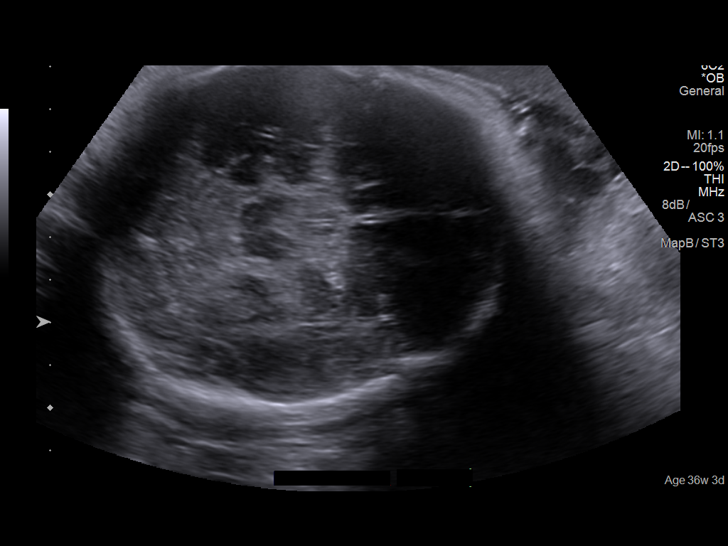
[im 16/59]
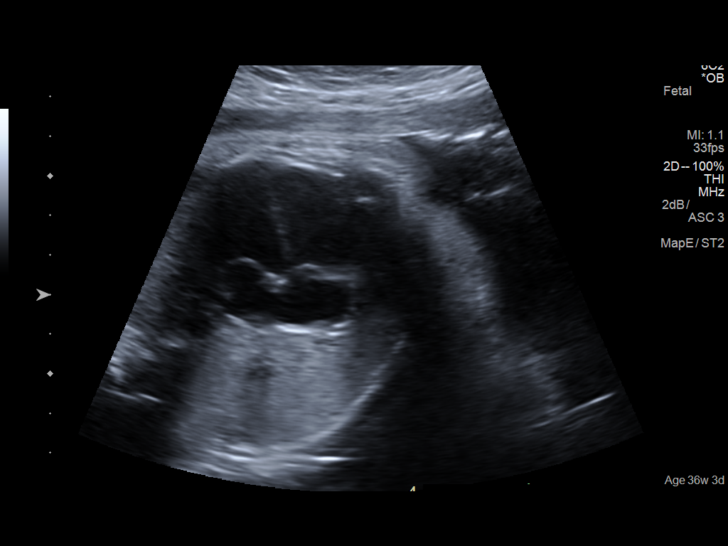
[im 20/59]
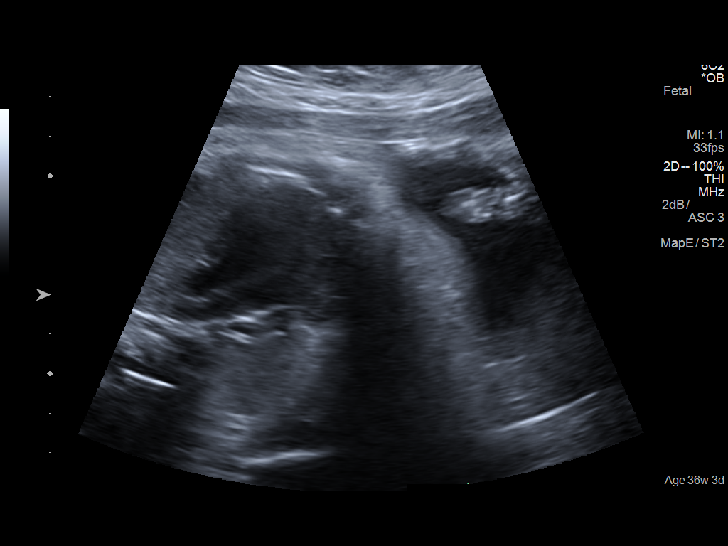
[im 24/59]
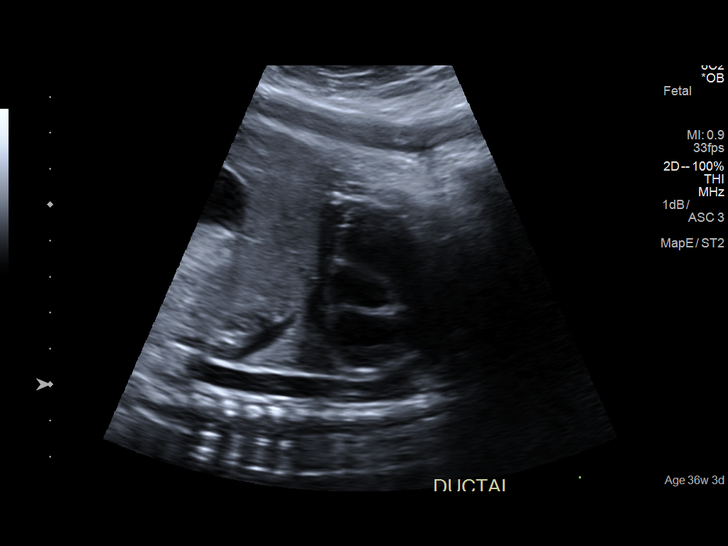
[im 31/59]
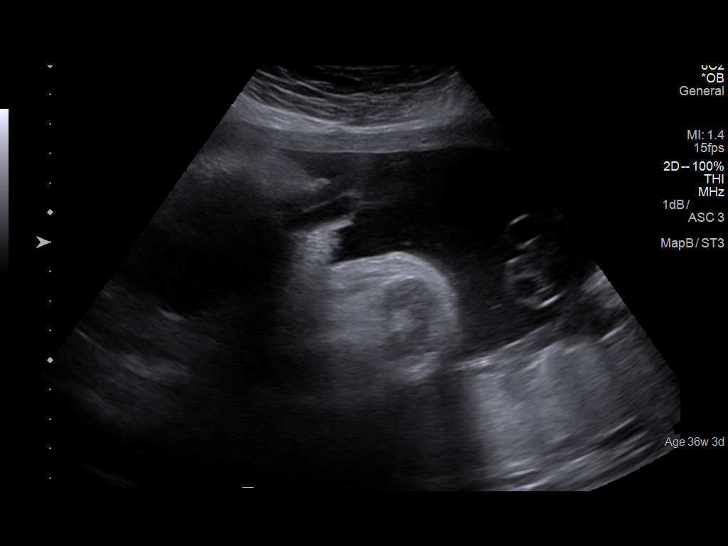
[im 35/59]
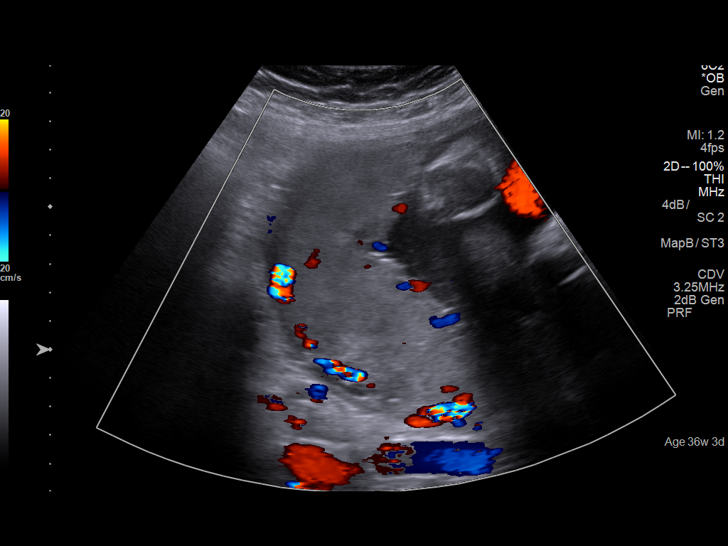
[im 39/59]
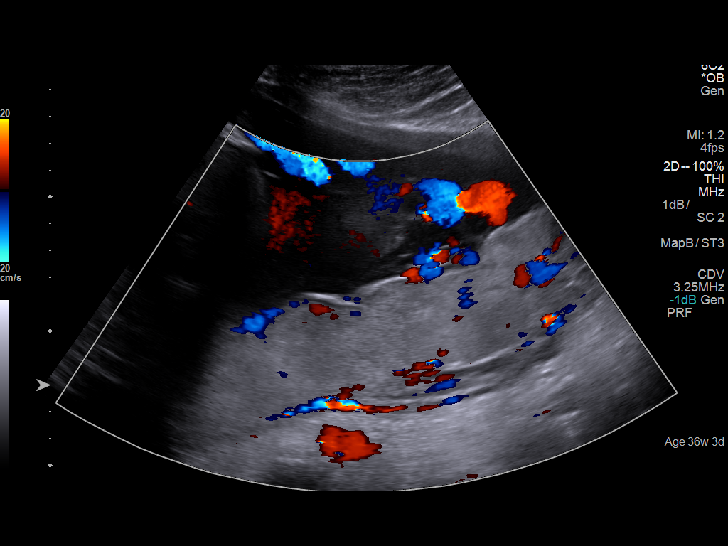
[im 43/59]
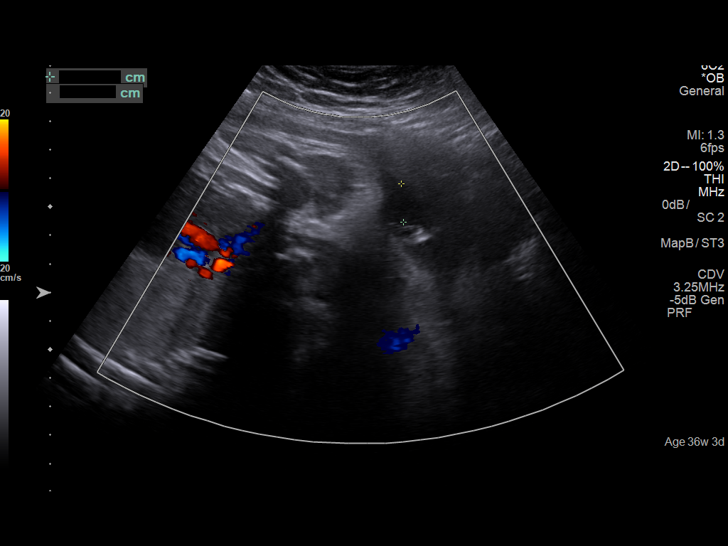
[im 48/59]
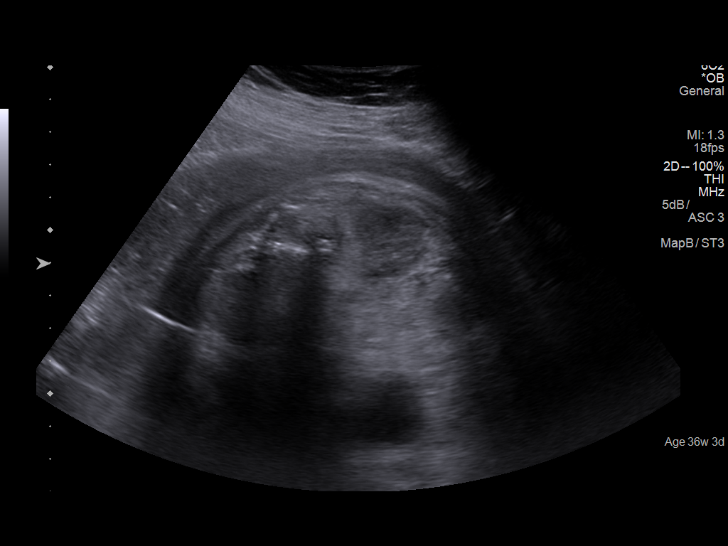
[im 52/59]
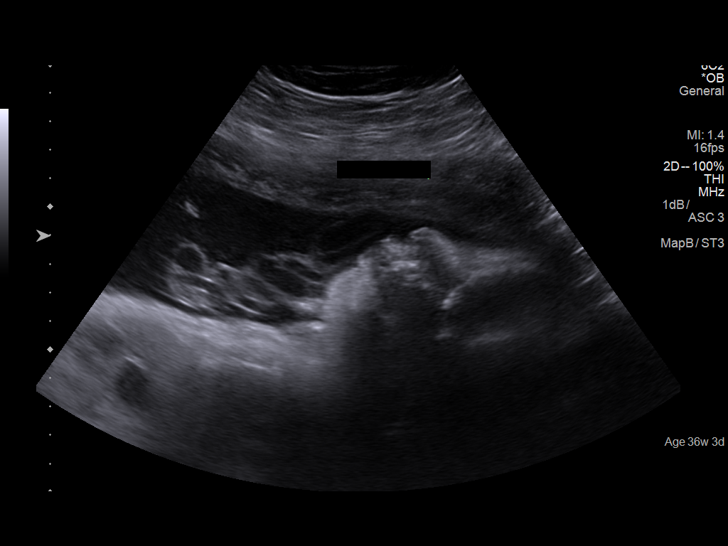
[im 56/59]
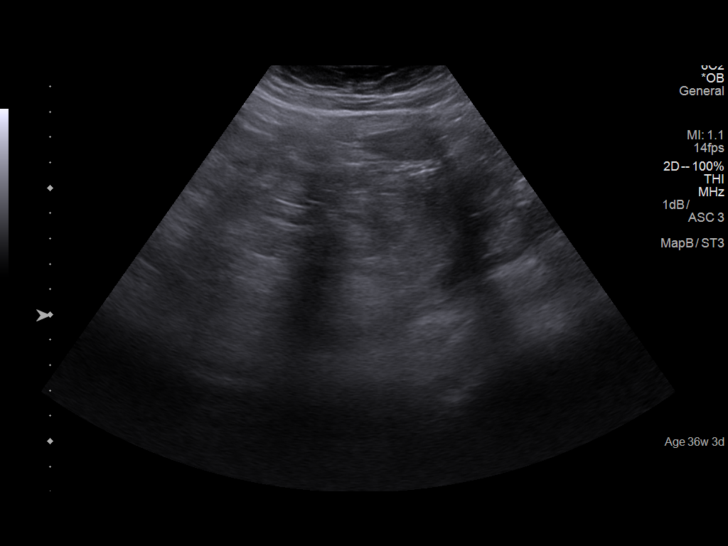

[13 of 28 positions shown; findings below may reference images not displayed]

FINDINGS: Number of Fetuses: 1

Heart Rate:  139 bpm

Movement: Yes

Presentation: Cephalic

Previa: No

Placental Location: Posterior and left lateral

Amniotic Fluid (Subjective): Within normal limits

Amniotic Fluid (Objective):

AFI 12.4 cm (5%ile= 7.7 cm, 95%= 24.9 cm for 36 wks)

FETAL BIOMETRY

BPD:  8.6cm 34w 6d

HC:    30.5cm 33w 6d

AC:   32.6cm 36w 3d

FL:   6.6cm 34w 0d

Current Mean GA: 34w 3d US EDC: 11/18/2017

Assigned GA:  36w 3d Assigned EDC:  11/04/2017

Estimated Fetal Weight:  2,662g 25%ile

FETAL ANATOMY

Lateral Ventricles: Appears normal

Thalami/CSP: Appears normal

Posterior Fossa:  Appears normal

Nuchal Region: Appears normal

Upper Lip: Appears normal

Spine: Appears normal

4 Chamber Heart on Left: Appears normal

LVOT: Appears normal

RVOT: Appears normal

Stomach on Left: Appears normal

3 Vessel Cord: Appears normal

Cord Insertion site: Appears normal

Kidneys: Appears normal

Bladder: Appears normal

Extremities: Appears normal

Technically difficult due to: Advanced gestational age

MATERNAL FINDINGS:

Cervix: Not evaluated; >34 weeks
IMPRESSION: Assigned gestational age is currently 36 weeks 3 days. Estimated
fetal weight currently at 25th percentile.

No fetal anomalies seen involving visualized anatomy noted above.

Amniotic fluid volume within normal limits, with AFI of 12.4 cm.

## 2020-07-05 ENCOUNTER — Ambulatory Visit (LOCAL_COMMUNITY_HEALTH_CENTER): Payer: Self-pay

## 2020-07-05 ENCOUNTER — Other Ambulatory Visit: Payer: Self-pay

## 2020-07-05 VITALS — BP 118/78 | Ht 59.0 in | Wt 212.0 lb

## 2020-07-05 DIAGNOSIS — Z3201 Encounter for pregnancy test, result positive: Secondary | ICD-10-CM

## 2020-07-05 LAB — PREGNANCY, URINE: Preg Test, Ur: POSITIVE — AB

## 2020-07-05 NOTE — Progress Notes (Signed)
Pt has supply of PNV but cannot keep them down right now, declined [facility] PNV. Pt desires prenatal care at [facility]; sent to preadmit.

## 2020-07-12 NOTE — Progress Notes (Signed)
Follow-up on +PT. Patient has new OB appointment at [facility] on 07/13/20.Marland KitchenBurt Knack, RN

## 2020-07-13 ENCOUNTER — Ambulatory Visit: Payer: Medicaid Other | Admitting: Advanced Practice Midwife

## 2020-07-13 ENCOUNTER — Other Ambulatory Visit: Payer: Self-pay

## 2020-07-13 VITALS — BP 119/77 | HR 83 | Temp 98.1°F | Wt 209.0 lb

## 2020-07-13 DIAGNOSIS — O0990 Supervision of high risk pregnancy, unspecified, unspecified trimester: Secondary | ICD-10-CM | POA: Insufficient documentation

## 2020-07-13 DIAGNOSIS — Z641 Problems related to multiparity: Secondary | ICD-10-CM

## 2020-07-13 DIAGNOSIS — O99212 Obesity complicating pregnancy, second trimester: Secondary | ICD-10-CM | POA: Diagnosis not present

## 2020-07-13 DIAGNOSIS — O0932 Supervision of pregnancy with insufficient antenatal care, second trimester: Secondary | ICD-10-CM | POA: Insufficient documentation

## 2020-07-13 DIAGNOSIS — Z8279 Family history of other congenital malformations, deformations and chromosomal abnormalities: Secondary | ICD-10-CM | POA: Insufficient documentation

## 2020-07-13 DIAGNOSIS — Z3482 Encounter for supervision of other normal pregnancy, second trimester: Secondary | ICD-10-CM | POA: Diagnosis not present

## 2020-07-13 DIAGNOSIS — U071 COVID-19: Secondary | ICD-10-CM | POA: Insufficient documentation

## 2020-07-13 DIAGNOSIS — R6252 Short stature (child): Secondary | ICD-10-CM

## 2020-07-13 DIAGNOSIS — O99210 Obesity complicating pregnancy, unspecified trimester: Secondary | ICD-10-CM

## 2020-07-13 DIAGNOSIS — Z8759 Personal history of other complications of pregnancy, childbirth and the puerperium: Secondary | ICD-10-CM | POA: Insufficient documentation

## 2020-07-13 LAB — HEMOGLOBIN, FINGERSTICK: Hemoglobin: 12.1 g/dL (ref 11.1–15.9)

## 2020-07-13 LAB — URINALYSIS
Bilirubin, UA: NEGATIVE
Glucose, UA: NEGATIVE
Ketones, UA: NEGATIVE
Leukocytes,UA: NEGATIVE
Nitrite, UA: NEGATIVE
Protein,UA: NEGATIVE
Specific Gravity, UA: 1.02 (ref 1.005–1.030)
Urobilinogen, Ur: 0.2 mg/dL (ref 0.2–1.0)
pH, UA: 6.5 (ref 5.0–7.5)

## 2020-07-13 LAB — WET PREP FOR TRICH, YEAST, CLUE
Trichomonas Exam: NEGATIVE
Yeast Exam: NEGATIVE

## 2020-07-13 NOTE — Progress Notes (Signed)
[facility] HEALTH DEPT [facility] [address] [place] [address] [phone]  INITIAL PRENATAL VISIT NOTE  Subjective:  Nicole Livingston is a 34 y.o.MHF nonsmoker G5P4004 (13,11,6,2) at [redacted]w[redacted]d being seen today to start prenatal care at the [facility] Department. She feels "happy" about planned pregnancy (stopped using condoms 03/2020).  34 yo employed husband of 9 years feels "happy" about pregnancy.  She is living with her husband and 4 kids.  Denies cigs, vaping, cigars, MJ.  Last ETOH 4 years ago (1 [place]).  Unsure LMP maybe 01/27/20 with +PT 05/2020 with +fetal movement.  C/o N&V 1-2x/day with last vomiting yesterday. She is currently monitored for the following issues for this high-risk pregnancy and has History of shoulder dystocia in prior pregnancy 05/2014 (7#14); History of postpartum hemorrhage 500 cc 05/2014; Grand multipara; Obesity affecting pregnancy BMI 42.2; Prenatal care, subsequent pregnancy, second trimester; Lactating mother of 4 yo; and Clinical diagnosis of COVID-19 on 03/2019 on their problem list.  Patient reports no complaints.  Contractions: Not present. Vag. Bleeding: None.  Movement: Present. Denies leaking of fluid.   Indications for ASA therapy (per uptodate) One of the following: Previous pregnancy with preeclampsia, especially early onset and with an adverse outcome No Multifetal gestation No Chronic hypertension No Type 1 or 2 diabetes mellitus No Chronic kidney disease No Autoimmune disease (antiphospholipid syndrome, systemic lupus erythematosus) No  Two or more of the following: Nulliparity No Obesity (body mass index >30 kg/m2) Yes Family history of preeclampsia in mother or sister No Age ?35 years No Sociodemographic characteristics (African American race, low socioeconomic level) No Personal risk factors (eg, previous pregnancy with low birth weight or small for gestational age infant,  previous adverse pregnancy outcome [eg, stillbirth], interval >10 years between pregnancies) No   The following portions of the patient's history were reviewed and updated as appropriate: allergies, current medications, past family history, past medical history, past social history, past surgical history and problem list. Problem list updated.  Objective:   Vitals:   07/13/20 1336  BP: 119/77  Pulse: 83  Temp: 98.1 F (36.7 C)  Weight: 209 lb (94.8 kg)    Fetal Status: Fetal Heart Rate (bpm): 160 Fundal Height: 16 cm Movement: Present  Presentation: Undeterminable   Physical Exam Vitals and nursing note reviewed.  Constitutional:      General: She is not in acute distress.    Appearance: Normal appearance. She is well-developed. She is obese.  HENT:     Head: Normocephalic and atraumatic.     Right Ear: External ear normal.     Left Ear: External ear normal.     Nose: Nose normal. No congestion or rhinorrhea.     Mouth/Throat:     Lips: Pink.     Mouth: Mucous membranes are moist.     Dentition: Normal dentition. No dental caries.     Pharynx: Oropharynx is clear. Uvula midline.  Eyes:     General: No scleral icterus.    Conjunctiva/sclera: Conjunctivae normal.  Neck:     Thyroid: No thyroid mass or thyromegaly.  Cardiovascular:     Rate and Rhythm: Normal rate.     Pulses: Normal pulses.     Comments: Extremities are warm and well perfused Pulmonary:     Effort: Pulmonary effort is normal.     Breath sounds: Normal breath sounds.  Chest:     Breasts: Breasts are symmetrical.  Right: Normal. No mass, nipple discharge or skin change.        Left: Normal. No mass, nipple discharge or skin change.       Comments: Boil that burst, erythematous, not oozing Abdominal:     Palpations: Abdomen is soft.     Tenderness: There is no abdominal tenderness.     Comments: Gravid soft without tenderness, 14-16 wk size, FHR 160  Genitourinary:    General: Normal vulva.      Exam position: Lithotomy position.     Pubic Area: No rash.      Labia:        Right: No rash.        Left: No rash.      Vagina: Vaginal discharge (white creamy leukorrhea, ph<4.5) present.     Cervix: Friability (sl friable to pap) present. No cervical motion tenderness.     Uterus: Normal. Enlarged (Gravid 14-16 wk size). Not tender.      Rectum: Normal. No external hemorrhoid.  Musculoskeletal:     Right lower leg: No edema.     Left lower leg: No edema.  Lymphadenopathy:     Upper Body:     Right upper body: No axillary adenopathy.     Left upper body: No axillary adenopathy.  Skin:    General: Skin is warm.     Capillary Refill: Capillary refill takes less than 2 seconds.  Neurological:     Mental Status: She is alert.     Assessment and Plan:  Pregnancy: G5P4004 at [redacted]w[redacted]d  1. History of shoulder dystocia in prior pregnancy 05/2014 (7#14) Alert L&D  2. History of postpartum hemorrhage 500 cc 05/2014   3. Grand multipara G5P4  4. Obesity affecting pregnancy, antepartum Too late for ASA 81 mg Counseled on total weight gain of 11-20 lbs  5. Prenatal care, subsequent pregnancy, second trimester Too late in care for FIRST screen Dating u/s ordered Last pap 04/19/17 neg with no HPV ordered  - WET PREP FOR TRICH, YEAST, CLUE - Hemoglobin, venipuncture - Urinalysis (Urine Dip) - 585277 Drug Screen - IGP, Aptima HPV - HIV Antibody (routine testing w rflx) - Prenatal profile without Varicella or Rubella - HCV Ab w Reflex to Quant PCR - Urine Culture - Chlamydia/GC NAA, Confirmation - Glucose, 1 hour gestational - Hgb A1c w/o eAG - Comprehensive metabolic panel - Protein / creatinine ratio, urine - TSH  6. Lactating mother of 4 yo Counseled to increase daily protein and may continue breastfeeding  7. Clinical diagnosis of COVID-19 on 03/2019    Discussed overview of care and coordination with inpatient delivery practices including [facility], [facility] and [facility].   Reviewed Centering pregnancy as standard of care at [facility], oriented to room and showed video. Based on EDD, plan for Cycle     Preterm labor symptoms and general obstetric precautions including but not limited to vaginal bleeding, contractions, leaking of fluid and fetal movement were reviewed in detail with the patient.  Please refer to After Visit Summary for other counseling recommendations.   Return in about 2 weeks (around 07/27/2020) for routine PNC.  No future appointments.  Alberteen Spindle, CNM

## 2020-07-13 NOTE — Progress Notes (Signed)
Presents for MH IP. Describes irregular periods and states that LMP of 01/27/20 may not be consistent with her pregnancy duration. Takes PNV but c/o nausea and vomiting. Pap smear collected today.  New OB packet given, counseled. Desires Quad screen if appropriate dating. RN to call patient to schedule Quad. Desires flu shot at next appointment. U/S referral faxed. RN Sharlyne Pacas, RN

## 2020-07-14 ENCOUNTER — Telehealth: Payer: Self-pay | Admitting: Student

## 2020-07-14 ENCOUNTER — Telehealth: Payer: Self-pay

## 2020-07-14 DIAGNOSIS — R7309 Other abnormal glucose: Secondary | ICD-10-CM | POA: Insufficient documentation

## 2020-07-14 LAB — CBC/D/PLT+RPR+RH+ABO+AB SCR
Antibody Screen: NEGATIVE
Basophils Absolute: 0 10*3/uL (ref 0.0–0.2)
Basos: 0 %
EOS (ABSOLUTE): 0.1 10*3/uL (ref 0.0–0.4)
Eos: 1 %
Hematocrit: 36.2 % (ref 34.0–46.6)
Hemoglobin: 12.1 g/dL (ref 11.1–15.9)
Hepatitis B Surface Ag: NEGATIVE
Immature Grans (Abs): 0 10*3/uL (ref 0.0–0.1)
Immature Granulocytes: 0 %
Lymphocytes Absolute: 1.7 10*3/uL (ref 0.7–3.1)
Lymphs: 23 %
MCH: 28.8 pg (ref 26.6–33.0)
MCHC: 33.4 g/dL (ref 31.5–35.7)
MCV: 86 fL (ref 79–97)
Monocytes Absolute: 0.4 10*3/uL (ref 0.1–0.9)
Monocytes: 5 %
Neutrophils Absolute: 5 10*3/uL (ref 1.4–7.0)
Neutrophils: 71 %
Platelets: 348 10*3/uL (ref 150–450)
RBC: 4.2 x10E6/uL (ref 3.77–5.28)
RDW: 13 % (ref 11.7–15.4)
RPR Ser Ql: NONREACTIVE
Rh Factor: POSITIVE
WBC: 7.2 10*3/uL (ref 3.4–10.8)

## 2020-07-14 LAB — PROTEIN / CREATININE RATIO, URINE
Creatinine, Urine: 78.3 mg/dL
Protein, Ur: 11.3 mg/dL
Protein/Creat Ratio: 144 mg/g creat (ref 0–200)

## 2020-07-14 LAB — COMPREHENSIVE METABOLIC PANEL
ALT: 18 IU/L (ref 0–32)
AST: 13 IU/L (ref 0–40)
Albumin/Globulin Ratio: 1.4 (ref 1.2–2.2)
Albumin: 4.2 g/dL (ref 3.8–4.8)
Alkaline Phosphatase: 70 IU/L (ref 44–121)
BUN/Creatinine Ratio: 15 (ref 9–23)
BUN: 8 mg/dL (ref 6–20)
Bilirubin Total: <0.2 mg/dL (ref 0.0–1.2)
CO2: 22 mmol/L (ref 20–29)
Calcium: 9.1 mg/dL (ref 8.7–10.2)
Chloride: 100 mmol/L (ref 96–106)
Creatinine, Ser: 0.53 mg/dL — ABNORMAL LOW (ref 0.57–1.00)
GFR calc Af Amer: 145 mL/min/{1.73_m2} (ref >59)
GFR calc non Af Amer: 126 mL/min/{1.73_m2} (ref >59)
Globulin, Total: 3 g/dL (ref 1.5–4.5)
Glucose: 155 mg/dL — ABNORMAL HIGH (ref 65–99)
Potassium: 4.1 mmol/L (ref 3.5–5.2)
Sodium: 132 mmol/L — ABNORMAL LOW (ref 134–144)
Total Protein: 7.2 g/dL (ref 6.0–8.5)

## 2020-07-14 LAB — HCV INTERPRETATION

## 2020-07-14 LAB — GLUCOSE, 1 HOUR GESTATIONAL: Gestational Diabetes Screen: 146 mg/dL — ABNORMAL HIGH (ref 65–139)

## 2020-07-14 LAB — HCV AB W REFLEX TO QUANT PCR: HCV Ab: 0.1 s/co ratio (ref 0.0–0.9)

## 2020-07-14 LAB — HGB A1C W/O EAG: Hgb A1c MFr Bld: 6.2 % — ABNORMAL HIGH (ref 4.8–5.6)

## 2020-07-14 LAB — TSH: TSH: 1.17 u[IU]/mL (ref 0.450–4.500)

## 2020-07-14 LAB — HIV ANTIBODY (ROUTINE TESTING W REFLEX): HIV Screen 4th Generation wRfx: NONREACTIVE

## 2020-07-14 NOTE — Telephone Encounter (Signed)
Patient returned RN call. Patient counseled on need for 3 hour gtt due to elevated 1 hour gtt (146). Patient was scheduled for Quad screen on Monday afternoon, appointment rescheduled for 07/19/20 at 0800. Patient will have 3 hour gtt and quad screen at that time. Fasting protocol explained to patient, questions answered and patient agrees to plan. Burt Knack, RN

## 2020-07-14 NOTE — Telephone Encounter (Signed)
Attempt TC to patient to schedule Quad Screen lab apt in RN clinic.Unable to leave message. Based on LMP from, pt chart lists patient at 27 wks.  Patient measured to be between 16-18 weeks  on 07/14/20 at IP appointment. Next RV is on 11/30, in which pt may be out of window to received screen. Sharlyne Pacas, RN

## 2020-07-15 ENCOUNTER — Other Ambulatory Visit: Payer: Self-pay | Admitting: Advanced Practice Midwife

## 2020-07-15 DIAGNOSIS — Z3482 Encounter for supervision of other normal pregnancy, second trimester: Secondary | ICD-10-CM

## 2020-07-15 LAB — 789231 7+OXYCODONE-BUND
Amphetamines, Urine: NEGATIVE ng/mL
BENZODIAZ UR QL: NEGATIVE ng/mL
Barbiturate screen, urine: NEGATIVE ng/mL
Cannabinoid Quant, Ur: NEGATIVE ng/mL
Cocaine (Metab.): NEGATIVE ng/mL
OPIATE SCREEN URINE: NEGATIVE ng/mL
Oxycodone/Oxymorphone, Urine: NEGATIVE ng/mL
PCP Quant, Ur: NEGATIVE ng/mL

## 2020-07-15 LAB — URINE CULTURE

## 2020-07-16 LAB — IGP, APTIMA HPV
HPV Aptima: NEGATIVE
PAP Smear Comment: 0

## 2020-07-16 LAB — CHLAMYDIA/GC NAA, CONFIRMATION
Chlamydia trachomatis, NAA: NEGATIVE
Neisseria gonorrhoeae, NAA: NEGATIVE

## 2020-07-19 ENCOUNTER — Other Ambulatory Visit: Payer: Self-pay

## 2020-07-19 ENCOUNTER — Other Ambulatory Visit: Payer: Medicaid Other

## 2020-07-19 DIAGNOSIS — O99210 Obesity complicating pregnancy, unspecified trimester: Secondary | ICD-10-CM

## 2020-07-19 DIAGNOSIS — Z3482 Encounter for supervision of other normal pregnancy, second trimester: Secondary | ICD-10-CM

## 2020-07-19 DIAGNOSIS — R7309 Other abnormal glucose: Secondary | ICD-10-CM

## 2020-07-19 NOTE — Progress Notes (Signed)
Per Epic, client has OB US plus 14 weeks scheduled for 07/27/2020 at 2:30 pm with expected arrival time of 2:00 pm (with full bladder). Jossie Ng, RN

## 2020-07-19 NOTE — Progress Notes (Signed)
Pt to RN clinic for 3 hr gtt and Quad screen. Pt state the last time she had anything to eat or drink other than small sips of water was before 8:00 pm last night. Pt has times to return to lab for 3 hr Gtt; Pt given instructions for 3 hr Gtt.  Pt reviewed and signed QUAD screen consent form; pt counseled.

## 2020-07-20 LAB — GLUCOSE TOLERANCE TEST, 6 HOUR
Glucose, 1 Hour GTT: 229 mg/dL — ABNORMAL HIGH (ref 65–199)
Glucose, 2 hour: 159 mg/dL — ABNORMAL HIGH (ref 65–139)
Glucose, 3 hour: 128 mg/dL — ABNORMAL HIGH (ref 65–109)
Glucose, GTT - Fasting: 97 mg/dL (ref 65–99)

## 2020-07-21 ENCOUNTER — Telehealth: Payer: Self-pay | Admitting: Advanced Practice Midwife

## 2020-07-21 ENCOUNTER — Encounter: Payer: Self-pay | Admitting: Advanced Practice Midwife

## 2020-07-21 ENCOUNTER — Other Ambulatory Visit: Payer: Self-pay | Admitting: Advanced Practice Midwife

## 2020-07-21 ENCOUNTER — Telehealth: Payer: Self-pay

## 2020-07-21 DIAGNOSIS — O24419 Gestational diabetes mellitus in pregnancy, unspecified control: Secondary | ICD-10-CM | POA: Insufficient documentation

## 2020-07-21 DIAGNOSIS — Z8632 Personal history of gestational diabetes: Secondary | ICD-10-CM | POA: Insufficient documentation

## 2020-07-21 MED ORDER — GLUCOSE BLOOD VI STRP: ORAL_STRIP | 12 refills | Status: AC

## 2020-07-21 MED ORDER — ACCU-CHEK SOFTCLIX LANCETS MISC: 12 refills | Status: AC

## 2020-07-21 MED ORDER — BLOOD GLUCOSE MONITOR KIT: PACK | 0 refills | Status: AC

## 2020-07-21 NOTE — Telephone Encounter (Signed)
T/C and message left to call re: 3 hr GTT results (gestational diabetic).  Hazle Coca, CNM [phone] 07/21/20

## 2020-07-21 NOTE — Telephone Encounter (Signed)
TC to patient to schedule appointment for Monday 07/26/20, per provider request. Patient counseled that [facility] will call her to set up appointment for GDM teaching, and that it's very important for her to go to that appointment. Patient states understanding. Patient also states she has glucometer and strips and lancets, and patient states she remembers how to check her BS.  Per Hazle Coca, patient counseled to check her BS twice daily until her [facility] appointment. She was counseled to check her fasting BS each morning and then check her BS 2 hours after her midday meal, each day. Patient states understanding and aware to expect call from [facility] at [facility].Burt Knack, RN

## 2020-07-21 NOTE — Telephone Encounter (Signed)
T/C to pt to inform her she is a gestational diabetic and now a high risk pregnancy.  Counseled on potential risks associated with diabetes for self and fetus and that [facility] will be calling her for 2 apts, that she will need to be seen weekly and bring blood sugars with her. Pt states she had gestational diabetes with her 2019 pregnancy.

## 2020-07-23 ENCOUNTER — Telehealth: Payer: Self-pay

## 2020-07-23 ENCOUNTER — Encounter: Payer: Self-pay | Admitting: Family Medicine

## 2020-07-23 NOTE — Telephone Encounter (Signed)
Client had [facility] IP on 07/13/2020 and reported LMP of 01/27/2020 (EGA 27, EDD 10/13/2020). Fundal height measured 16 - 18 weeks. During Quad screen form completion, LMP and EDD as above put on form and not LMP / EDD to reflect menses for a 16 - [redacted] week gestational age. Call from genetics lab initially by Peggy to notify RN specimen not able to be run due to advance EGA. Upon review of chart, RN realized above situation, returned Peggy's call and left message for her to call me back. As afternoon progressing and no return call, RN called back and receptionist indicated she would have Peggy call back. Call received from [place] and above explained. Kendal Hymen then provided a result report that included a 1:10 risk of Down Syndrome. Client has [place] scheduled for 07/27/2020 and report will be given to provider on 07/26/20 for review. Jossie Ng, RN

## 2020-07-26 ENCOUNTER — Ambulatory Visit: Payer: Medicaid Other | Admitting: Family Medicine

## 2020-07-26 ENCOUNTER — Other Ambulatory Visit: Payer: Self-pay

## 2020-07-26 ENCOUNTER — Telehealth: Payer: Self-pay

## 2020-07-26 VITALS — BP 112/69 | HR 87 | Temp 98.5°F | Wt 206.2 lb

## 2020-07-26 DIAGNOSIS — O99213 Obesity complicating pregnancy, third trimester: Secondary | ICD-10-CM

## 2020-07-26 DIAGNOSIS — O0993 Supervision of high risk pregnancy, unspecified, third trimester: Secondary | ICD-10-CM | POA: Diagnosis not present

## 2020-07-26 DIAGNOSIS — O0990 Supervision of high risk pregnancy, unspecified, unspecified trimester: Secondary | ICD-10-CM

## 2020-07-26 DIAGNOSIS — O24410 Gestational diabetes mellitus in pregnancy, diet controlled: Secondary | ICD-10-CM | POA: Diagnosis not present

## 2020-07-26 DIAGNOSIS — Z8759 Personal history of other complications of pregnancy, childbirth and the puerperium: Secondary | ICD-10-CM | POA: Diagnosis not present

## 2020-07-26 LAB — URINALYSIS
Bilirubin, UA: NEGATIVE
Glucose, UA: NEGATIVE
Leukocytes,UA: NEGATIVE
Nitrite, UA: NEGATIVE
Specific Gravity, UA: 1.03 (ref 1.005–1.030)
Urobilinogen, Ur: 0.2 mg/dL (ref 0.2–1.0)
pH, UA: 6 (ref 5.0–7.5)

## 2020-07-26 NOTE — Telephone Encounter (Signed)
Dr. Alvester Morin notified of Quad screening results (received via phone call on Friday 07/23/2020) and to hold results on nurse to do cart until [place] report received (scheduled for 07/27/2020). Jossie Ng, RN

## 2020-07-26 NOTE — Telephone Encounter (Signed)
RN did not intend to open a telephone encounter on client at this time. Jossie Ng, RN

## 2020-07-26 NOTE — Progress Notes (Signed)
PRENATAL VISIT NOTE  Subjective:  Nicole Livingston is a 34 y.o. G5P4004 at [redacted]w[redacted]d being seen today for ongoing prenatal care.  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 1/2 brother born with hole in heart: no surgery needed); Gestational diabetes mellitus (GDM), antepartum; and History of gestational diabetes 2019 pregnancy on their problem list.  Patient reports no complaints.  Contractions: Not present. Vag. Bleeding: None.  Movement: Present. Denies leaking of fluid/ROM.   The following portions of the patient's history were reviewed and updated as appropriate: allergies, current medications, past family history, past medical history, past social history, past surgical history and problem list. Problem list updated.  Objective:   Vitals:   07/26/20 1332  BP: 112/69  Pulse: 87  Temp: 98.5 F (36.9 C)  Weight: 206 lb 3.2 oz (93.5 kg)    Fetal Status: Fetal Heart Rate (bpm): 145 Fundal Height: 16 cm Movement: Present     General:  Alert, oriented and cooperative. Patient is in no acute distress.  Skin: Skin is warm and dry. No rash noted.   Cardiovascular: Normal heart rate noted  Respiratory: Normal respiratory effort, no problems with respiration noted  Abdomen: Soft, gravid, appropriate for gestational age.  Pain/Pressure: Absent     Pelvic: Cervical exam deferred        Extremities: Normal range of motion.  Edema: None  Mental Status: Normal mood and affect. Normal behavior. Normal judgment and thought content.   Assessment and Plan:   Pregnancy: G5P4004 at [redacted]w[redacted]d  1. Diet controlled gestational diabetes mellitus (GDM) in second trimester - Reviewed BG log- patient reported trouble with remembering paper log, encouraged to use phone if needed.   Fasting 93-110 (4 of 5 >95)  PP (log did not specify after which meal) 110-120 (only 1 of 4 out of range) - Diet recall  Dinner 11/14- Chicken, 3 tortillas  Bfast 11/15- 3 eggs, wheat toast and avocado -Reviewed focus on protein at meals. Patient is having nausea but likes eggs and encouraged her to eat these if they are appealing.  -Patient routinely waits 1-2 hrs before eating and is not checking her sugar at waking. Discussed regular eating as important for BG stabilization -Has [facility] appt scheduled - Urinalysis (Urine Dip)  2. Supervision of high risk pregnancy, antepartum Dating is unsure-- by LMP is 28wk but by FH is more like 18wks. [place] scheduled for tomorrow to help with pregnancy dating Reviewed abnormal Quad result and likelihood it will need to recalculated based on [place] Up to date currently routine pregnancy labs, again will adjust plan once GA known.  Flu shot today  3. Obesity affecting pregnancy in third trimester TWG=3 lb 3.2 oz (1.452 kg)  Has [facility] appt Weight gain at goal currently Continue to encourage stable/steady weight gain with twg goal of 11-15lbs  4. Lactating mother of 4 yo Encourage hydration and calorie intake  5. History of postpartum hemorrhage 500 cc 05/2014 LD aware.   Preterm labor symptoms and general obstetric precautions including but not limited to vaginal bleeding, contractions, leaking of fluid and fetal movement were reviewed in detail with the  patient. Please refer to After Visit Summary for other counseling recommendations.   Return in about 2 weeks (around 08/09/2020) for Routine prenatal care, BG log check.  Future Appointments  Date Time Provider Department Center  07/27/2020  2:30 PM [facility]   07/29/2020  3:00 PM Shotwell, Orlean Bradford, RN [facility] None  08/10/2020  3:00 PM [facility] PROVIDER [facility] None    Federico Flake, MD

## 2020-07-26 NOTE — Progress Notes (Addendum)
Aware of 07/27/2020 [facility] [place] appt with arrival time of 2:00 pm. Jossie Ng, RN  Call to [facility] and appt scheduled for 07/29/2020 with arrival time of 2:45 pm. Per Inetta Fermo at [facility], incorrect diagnosis code on referral and needs to be corrected. Dr. Alvester Morin aware. Jossie Ng, RN  Urine dip reviewed by Dr. Alvester Morin and no new orders given. Jossie Ng, RN

## 2020-07-27 ENCOUNTER — Ambulatory Visit
Admission: RE | Admit: 2020-07-27 | Discharge: 2020-07-27 | Disposition: A | Discharge status: Home / Self Care | Payer: Self-pay | Source: Ambulatory Visit | Attending: Advanced Practice Midwife | Admitting: Advanced Practice Midwife

## 2020-07-27 ENCOUNTER — Other Ambulatory Visit: Payer: Self-pay

## 2020-07-27 DIAGNOSIS — Z3482 Encounter for supervision of other normal pregnancy, second trimester: Secondary | ICD-10-CM

## 2020-07-27 NOTE — Addendum Note (Signed)
Addended by: Geanie Berlin on: 07/27/2020 04:06 PM   Modules accepted: Orders

## 2020-07-28 ENCOUNTER — Encounter: Payer: Self-pay | Admitting: Advanced Practice Midwife

## 2020-07-28 DIAGNOSIS — O280 Abnormal hematological finding on antenatal screening of mother: Secondary | ICD-10-CM | POA: Insufficient documentation

## 2020-07-28 DIAGNOSIS — O4440 Low lying placenta NOS or without hemorrhage, unspecified trimester: Secondary | ICD-10-CM | POA: Insufficient documentation

## 2020-07-29 ENCOUNTER — Encounter: Payer: Self-pay | Attending: Family Medicine | Admitting: *Deleted

## 2020-07-29 ENCOUNTER — Encounter: Payer: Self-pay | Admitting: *Deleted

## 2020-07-29 ENCOUNTER — Other Ambulatory Visit: Payer: Self-pay

## 2020-07-29 VITALS — BP 100/70 | Ht 60.0 in | Wt 207.8 lb

## 2020-07-29 DIAGNOSIS — Z3A00 Weeks of gestation of pregnancy not specified: Secondary | ICD-10-CM | POA: Insufficient documentation

## 2020-07-29 DIAGNOSIS — O99213 Obesity complicating pregnancy, third trimester: Secondary | ICD-10-CM | POA: Insufficient documentation

## 2020-07-29 DIAGNOSIS — O24410 Gestational diabetes mellitus in pregnancy, diet controlled: Secondary | ICD-10-CM | POA: Insufficient documentation

## 2020-07-29 DIAGNOSIS — E669 Obesity, unspecified: Secondary | ICD-10-CM | POA: Insufficient documentation

## 2020-07-29 NOTE — Patient Instructions (Signed)
Read booklet on Gestational Diabetes Follow Gestational Meal Planning Guidelines Include 1 serving of protein and 1 serving of carbohydrate for snacks Always carry fast acting glucose and a snack Check blood sugars 4 x day - before breakfast and 2 hrs after every meal and record  Bring blood sugar log to all MD appointments Call MD for prescription for meter strips and lancets Strips Accu-Chek Guide Lancets   Accu-Chek Softclix Purchase urine ketone strips if instructed by MD and check urine ketones every am:  If + increase bedtime snack to 1 protein and 2 carbohydrate servings Walk 20-30 minutes at least 5 x week if permitted by MD

## 2020-07-29 NOTE — Progress Notes (Signed)
Diabetes Self-Management Education  Visit Type: First/Initial  Appt. Start Time: 1500 Appt. End Time: 1630  07/29/2020  Ms. Nicole Livingston, identified by name and date of birth, is a 34 y.o. female with a diagnosis of Diabetes: Gestational Diabetes.   ASSESSMENT  Blood pressure 100/70, height 5' (1.524 m), weight 207 lb 12.8 oz (94.3 kg), unsure of last menstrual period, estimated date of delivery 01/20/2021 Body mass index is 40.58 kg/m.   Diabetes Self-Management Education - 07/29/20 1644      Visit Information   Visit Type First/Initial      Initial Visit   Diabetes Type Gestational Diabetes    Are you currently following a meal plan? No    Are you taking your medications as prescribed? Yes    Date Diagnosed 2 weeks ago      Health Coping   How would you rate your overall health? Good      Psychosocial Assessment   Patient Belief/Attitude about Diabetes Other (comment)   "concerned- makes me feel more careful about what I eat, anxious at times"   Self-care barriers None    Self-management support Doctor's office;Family    Patient Concerns Nutrition/Meal planning;Glycemic Control;Monitoring;Healthy Lifestyle;Other (comment)   become more fit   Special Needs None    Preferred Learning Style Auditory;Visual    Learning Readiness Change in progress    How often do you need to have someone help you when you read instructions, pamphlets, or other written materials from your doctor or pharmacy? 1 - Never    What is the last grade level you completed in school? 12th grade/graduate      Pre-Education Assessment   Patient understands the diabetes disease and treatment process. Needs Review    Patient understands incorporating nutritional management into lifestyle. Needs Instruction    Patient undertands incorporating physical activity into lifestyle. Needs Instruction    Patient understands using medications safely. Needs Instruction    Patient understands monitoring blood  glucose, interpreting and using results Needs Review    Patient understands prevention, detection, and treatment of acute complications. Needs Instruction    Patient understands prevention, detection, and treatment of chronic complications. Needs Instruction    Patient understands how to develop strategies to address psychosocial issues. Needs Instruction    Patient understands how to develop strategies to promote health/change behavior. Needs Instruction      Complications   Last HgB A1C per patient/outside source 6.2 %   07/13/2020   How often do you check your blood sugar? 0 times/day (not testing)   Pt reports her meter needs new battery. Provided Accu-Chek Guide Me meter and instructed on use (meter approved by Medicaid). BG upon return demonstration was 89 mg/dL at 1:54 pm - 5 hrs pp.   Fasting Blood glucose range (mg/dL) 00-867   She reported 3 FBG's of 90, 106 and 102 mg/dL.   Postprandial Blood glucose range (mg/dL) 61-950   She reported 1 pp reading of 105 mg/dL.   Number of hypoglycemic episodes per month --   Pt reports she had low blood sugars prior to pregnancy.   Have you had a dilated eye exam in the past 12 months? No    Have you had a dental exam in the past 12 months? No    Are you checking your feet? No      Dietary Intake   Breakfast reports limited food selection due to pregnacy - eats 4 eggs, cheese, toast and avocado for breakfast  Snack (morning) 1 snack/day - low fat yogurt, almonds, fruit (bananas, apples)    Lunch chicken and veggies (broccoli, carrots, cuccumbers), small amount or rice    Dinner reports "bland" foods - chicken, little meat due to pregnancy; potatoes, corn, beans, 1 tortilla, pasta    Beverage(s) water, unsweetened tea      Exercise   Exercise Type ADL's      Patient Education   Previous Diabetes Education Yes (please comment)   2018 - prior Gestational and had education with [place]   Disease state  Definition of diabetes, type 1 and 2, and the  diagnosis of diabetes;Factors that contribute to the development of diabetes    Nutrition management  Role of diet in the treatment of diabetes and the relationship between the three main macronutrients and blood glucose level;Food label reading, portion sizes and measuring food.;Reviewed blood glucose goals for pre and post meals and how to evaluate the patients' food intake on their blood glucose level.   Food safety during pregnancy   Physical activity and exercise  Role of exercise on diabetes management, blood pressure control and cardiac health.    Medications Other (comment)   Limited use of oral medications during pregnancy and potential for insulin   Monitoring Taught/evaluated SMBG meter.;Purpose and frequency of SMBG.;Taught/discussed recording of test results and interpretation of SMBG.;Identified appropriate SMBG and/or A1C goals.;Ketone testing, when, how.    Acute complications Taught treatment of hypoglycemia - the 15 rule.    Chronic complications Relationship between chronic complications and blood glucose control    Psychosocial adjustment Identified and addressed patients feelings and concerns about diabetes    Preconception care Pregnancy and GDM  Role of pre-pregnancy blood glucose control on the development of the fetus;Reviewed with patient blood glucose goals with pregnancy;Role of family planning for patients with diabetes      Individualized Goals (developed by patient)   Reducing Risk Other (comment)   improve blood sugars, prevent diabetes complications, lead a healthier lifestyle, become more fit     Outcomes   Expected Outcomes Demonstrated interest in learning. Expect positive outcomes    Program Status Not Completed           Individualized Plan for Diabetes Self-Management Training:   Learning Objective:  Patient will have a greater understanding of diabetes self-management. Patient education plan is to attend individual and/or group sessions per assessed  needs and concerns.   Plan:   Patient Instructions  Read booklet on Gestational Diabetes Follow Gestational Meal Planning Guidelines Include 1 serving of protein and 1 serving of carbohydrate for snacks Always carry fast acting glucose and a snack Check blood sugars 4 x day - before breakfast and 2 hrs after every meal and record  Bring blood sugar log to all MD appointments Call MD for prescription for meter strips and lancets Strips Accu-Chek Guide Lancets   Accu-Chek Softclix Purchase urine ketone strips if instructed by MD and check urine ketones every am:  If + increase bedtime snack to 1 protein and 2 carbohydrate servings Walk 20-30 minutes at least 5 x week if permitted by MD  Expected Outcomes:  Demonstrated interest in learning. Expect positive outcomes  Education material provided:  Gestational Booklet Gestational Meal Planning Guidelines Simple Meal Plan Meter = Accu-Chek Guide Me Goals for a Healthy Pregnancy Food Safety Glucose tablets Symptoms, causes and treatments of Hypoglycemia  If problems or questions, patient to contact team via:  Sharion Settler, RN, CCM, CDCES [phone]  Future DSME  appointment: PRN The patient reports her Medicaid will be ending and she doesn't want to return for further education at this time. She is aware that she can return for 1 more visit with the dietitian. She was seen in our clinic in 2018 for Gestational Diabetes with her last child.

## 2020-08-09 ENCOUNTER — Ambulatory Visit: Payer: Medicaid Other | Admitting: Advanced Practice Midwife

## 2020-08-09 ENCOUNTER — Other Ambulatory Visit: Payer: Self-pay

## 2020-08-09 VITALS — BP 113/82 | HR 98 | Temp 98.3°F | Wt 207.0 lb

## 2020-08-09 DIAGNOSIS — O0993 Supervision of high risk pregnancy, unspecified, third trimester: Secondary | ICD-10-CM | POA: Diagnosis not present

## 2020-08-09 DIAGNOSIS — O24419 Gestational diabetes mellitus in pregnancy, unspecified control: Secondary | ICD-10-CM | POA: Diagnosis not present

## 2020-08-09 DIAGNOSIS — Z8759 Personal history of other complications of pregnancy, childbirth and the puerperium: Secondary | ICD-10-CM

## 2020-08-09 DIAGNOSIS — Z8632 Personal history of gestational diabetes: Secondary | ICD-10-CM

## 2020-08-09 DIAGNOSIS — O99213 Obesity complicating pregnancy, third trimester: Secondary | ICD-10-CM

## 2020-08-09 DIAGNOSIS — O0932 Supervision of pregnancy with insufficient antenatal care, second trimester: Secondary | ICD-10-CM

## 2020-08-09 DIAGNOSIS — O09899 Supervision of other high risk pregnancies, unspecified trimester: Secondary | ICD-10-CM | POA: Insufficient documentation

## 2020-08-09 DIAGNOSIS — O4440 Low lying placenta NOS or without hemorrhage, unspecified trimester: Secondary | ICD-10-CM

## 2020-08-09 DIAGNOSIS — O0990 Supervision of high risk pregnancy, unspecified, unspecified trimester: Secondary | ICD-10-CM

## 2020-08-09 DIAGNOSIS — O280 Abnormal hematological finding on antenatal screening of mother: Secondary | ICD-10-CM

## 2020-08-09 DIAGNOSIS — O0933 Supervision of pregnancy with insufficient antenatal care, third trimester: Secondary | ICD-10-CM | POA: Diagnosis not present

## 2020-08-09 DIAGNOSIS — Z641 Problems related to multiparity: Secondary | ICD-10-CM

## 2020-08-09 LAB — URINALYSIS
Bilirubin, UA: NEGATIVE
Glucose, UA: NEGATIVE
Nitrite, UA: NEGATIVE
Specific Gravity, UA: 1.015 (ref 1.005–1.030)
Urobilinogen, Ur: 0.2 mg/dL (ref 0.2–1.0)
pH, UA: 7 (ref 5.0–7.5)

## 2020-08-09 NOTE — Progress Notes (Signed)
Presents for MH RV at 16.4 weeks. Does not take PNV daily d/t nausea and heart burn. Denies ED/Hospital visit since last RV. BG log handwritten and incomplete- copied. Urine dip collected, reviewed by provider. Sharlyne Pacas, RN

## 2020-08-09 NOTE — Progress Notes (Signed)
[facility] [place] referral and transfer of care referral with demographics / records faxed to [facility] with fax confirmation received. Client aware will receive a call from [facility] scheduler with appts for both of above. Client to schedule [facility] MHC RV appt in one week (while waiting on transfer appt).  Jossie Ng, RN

## 2020-08-09 NOTE — Progress Notes (Signed)
PRENATAL VISIT NOTE  Subjective:  Nicole Livingston is a 34 y.o. G5P4004 at [redacted]w[redacted]d being seen today for ongoing prenatal care.  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 1/2 brother born with hole in heart: no surgery needed); gestational diabetic on 07/19/20 @13  wks with transfer of care to [facility] (likely Type II diabetic); History of gestational diabetes 2019 pregnancy; Low-lying placenta on 07/27/20 u/s; Abnormal quad screen 07/19/20 increased risk Down's 1:10 -- dates inaccurate; and Noncompliant diabetic pregnant patient on their problem list.  Patient reports nausea.  Contractions: Not present. Vag. Bleeding: None.  Movement: Present. Denies leaking of fluid/ROM.   The following portions of the patient's history were reviewed and updated as appropriate: allergies, current medications, past family history, past medical history, past social history, past surgical history and problem list. Problem list updated.  Objective:   Vitals:   08/09/20 1329  BP: 113/82  Pulse: 98  Temp: 98.3 F (36.8 C)  Weight: 207 lb (93.9 kg)    Fetal Status: Fetal Heart Rate (bpm): 130 Fundal Height: 16 cm Movement: Present     General:  Alert, oriented and cooperative. Patient is in no acute distress.  Skin: Skin is warm and dry. No rash noted.   Cardiovascular: Normal heart rate noted  Respiratory: Normal respiratory effort, no problems with respiration noted  Abdomen: Soft, gravid, appropriate for gestational age.  Pain/Pressure: Absent     Pelvic: Cervical exam deferred         Extremities: Normal range of motion.  Edema: None  Mental Status: Normal mood and affect. Normal behavior. Normal judgment and thought content.   Assessment and Plan:  Pregnancy: G5P4004 at [redacted]w[redacted]d  1. Gestational diabetes mellitus (GDM), antepartum, gestational diabetes method of control unspecified Diagnosed on 07/19/20 at 13 wks; needs to transfer prenatal care due to possible Type II diabetic (wasn't checked after last pregnancy at pp, hx gestational diabetes 2019, this dx at 13 wks). Pt brings in handwritten, incomplete notes of some fastings, even less 2 hr pp after breakfast, and no pp after lunch or dinner.  Pt given proper blood sugar log and provider filled into this log what she had. Blood sugar log values below. Encouraged continued daily exercise and diet modifications.  6 of  8 FBS values are abnormal (range was 90 to 105)  Only 8 values checked from 07/28/20-08/09/20   1 of 6 2hr pp values are abnormal (range was 84 to 120)  Only 6 values checked from 07/28/20-08/09/20 Exercise: no exercise Diet recall :   Breakfast =3 scrambled eggs with 1/2 avocado, 1 piece toast with pinto beans, water                      Lunch = 0                      Dinner =chicken, pinto beans, rice, water                       Snack =3 mozzarella cheese sticks and 1 protein shake Water: drinks about  3 bottles of water/day and night Kept one [facility] appt on 07/29/20 and refused second appt Long discussion with pt about diabetes during pregnancy and her high risk status now for self and fetus; potential implications during AP, IP, PP discussed.  Pt is noncompliant with diet, exercise, blood sugar log.  Discussed will be transferred to [facility] due to early dx at 13 wks.  Pt states she doesn't have Medicaid or insurance so wants to be transferred to [facility] for financial reasons.  Referral written for transfer of care to [facility].  - Urinalysis (Urine Dip)  2. Supervision of high risk pregnancy, antepartum Pt  states she is too busy to check blood sugars and keep up with log, or to eat 3x/day - Urine Culture & Sensitivity - QUAD Screen [facility] Only  3. Abnormal quad screen 07/19/20 increased risk Down's 1:10 -- dates inaccurate Quad screen redrawn today  4. Late prenatal care in second trimester 26 6/7 wks With dating u/s, initiation of prenatal care was at 12 wks  5. Obesity affecting pregnancy in third trimester 4 lb (1.814 kg)   6. Low-lying placenta on 07/27/20 u/s Discussed 07/27/20 u/s with pt at 14 5/7 with low lying posterior placenta, AFI wnl.  Recommend f/u u/s in 4-6 wks for placenta and anatomy--request filled out for [facility] u/s  7. Grand multipara G5P4   8. History of gestational diabetes 2019 pregnancy   9. History of shoulder dystocia in prior pregnancy 05/2014 (7#14)   10. Noncompliant pregnant patient, antepartum See above   Preterm labor symptoms and general obstetric precautions including but not limited to vaginal bleeding, contractions, leaking of fluid and fetal movement were reviewed in detail with the patient. Please refer to After Visit Summary for other counseling recommendations.  Return in about 1 week (around 08/16/2020) for routine [facility] while awaiting transfer to [facility].  Future Appointments  Date Time Provider Department Center  08/16/2020  3:00 PM [facility] PROVIDER [facility] None    Alberteen Spindle, CNM

## 2020-08-10 ENCOUNTER — Ambulatory Visit: Payer: Self-pay

## 2020-08-11 LAB — URINE CULTURE

## 2020-08-13 NOTE — Progress Notes (Signed)
Per [facility], client has following appts: 08/24/2020  1400 - phone appt with [facility], 1216/2021   1300 - [place] at [facility] and 08/26/2020   1430 - New OB appt at [facility] with Hilbert Odor CNM. [facility] scheduler has notified client of appts. Jossie Ng, RN

## 2020-08-16 ENCOUNTER — Telehealth: Payer: Self-pay | Admitting: Student

## 2020-08-16 ENCOUNTER — Ambulatory Visit: Payer: Self-pay

## 2020-08-16 NOTE — Telephone Encounter (Signed)
TC to patient to reschedule MH RV. Pt rescheduled for 12/9. Sharlyne Pacas, RN

## 2020-08-19 ENCOUNTER — Ambulatory Visit: Payer: Self-pay | Admitting: Family Medicine

## 2020-08-19 ENCOUNTER — Other Ambulatory Visit: Payer: Self-pay

## 2020-08-19 VITALS — BP 109/76 | HR 77 | Temp 98.2°F | Wt 205.2 lb

## 2020-08-19 DIAGNOSIS — Z91199 Patient's noncompliance with other medical treatment and regimen due to unspecified reason: Secondary | ICD-10-CM

## 2020-08-19 DIAGNOSIS — O280 Abnormal hematological finding on antenatal screening of mother: Secondary | ICD-10-CM

## 2020-08-19 DIAGNOSIS — O0990 Supervision of high risk pregnancy, unspecified, unspecified trimester: Secondary | ICD-10-CM

## 2020-08-19 DIAGNOSIS — O09899 Supervision of other high risk pregnancies, unspecified trimester: Secondary | ICD-10-CM

## 2020-08-19 LAB — URINALYSIS
Bilirubin, UA: POSITIVE — AB
Glucose, UA: NEGATIVE
Nitrite, UA: NEGATIVE
RBC, UA: NEGATIVE
Specific Gravity, UA: 1.025 (ref 1.005–1.030)
Urobilinogen, Ur: 0.2 mg/dL (ref 0.2–1.0)
pH, UA: 7 (ref 5.0–7.5)

## 2020-08-19 NOTE — Progress Notes (Addendum)
Presents for MH RV at 18.0 weeks. Takes PNV daily. Denies Ed/hospital visit since last RV. Urine dip collected and BS copied. Sharlyne Pacas, RN

## 2020-08-20 NOTE — Progress Notes (Addendum)
PRENATAL VISIT NOTE  Subjective:  Nicole Livingston is a 34 y.o. G5P4004 at [redacted]w[redacted]d being seen today for ongoing prenatal care.  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 1/2 brother born with hole in heart: no surgery needed); gestational diabetic on 07/19/20 @13  wks with transfer of care to [facility] (likely Type II diabetic); History of gestational diabetes 2019 pregnancy; Low-lying placenta on 07/27/20 u/s; and Noncompliant diabetic pregnant patient on their problem list.  Patient reports no complaints.  Contractions: Not present. Vag. Bleeding: None.  Movement: Present. Denies leaking of fluid/ROM.   The following portions of the patient's history were reviewed and updated as appropriate: allergies, current medications, past family history, past medical history, past social history, past surgical history and problem list. Problem list updated.  Objective:   Vitals:   08/19/20 1644  BP: 109/76  Pulse: 77  Temp: 98.2 F (36.8 C)  Weight: 205 lb 3.2 oz (93.1 kg)    Fetal Status: Fetal Heart Rate (bpm): 145 Fundal Height: 18 cm Movement: Present     General:  Alert, oriented and cooperative. Patient is in no acute distress.  Skin: Skin is warm and dry. No rash noted.   Cardiovascular: Normal heart rate noted  Respiratory: Normal respiratory effort, no problems with respiration noted  Abdomen: Soft, gravid, appropriate for gestational age.  Pain/Pressure: Absent     Pelvic: Cervical exam deferred        Extremities: Normal range of motion.  Edema: None  Mental  Status: Normal mood and affect. Normal behavior. Normal judgment and thought content.   Assessment and Plan:  Pregnancy: G5P4004 at [redacted]w[redacted]d  1. Supervision of high risk pregnancy, antepartum  Patient has no complaints about the pregnancy.    2. Noncompliant pregnant patient, antepartum Discussed with patient about glucose monitoring compliance.  Patient was missing several blood sugar check from pre and post lunch check and post dinner checks.  Informed patient that this monitoring allows [redacted]w[redacted]d to help her with decisions that need to be made to make sure there are no complications with her pregnancy.   Patient verbalizes that she understands and that she did not always have her sheet with her.  Encouraged her to make copies of the form or track the glucose numbers in her phone and transfer to the monitoring sheet.    Patient mentioned that she was upset about her care having to be transferred, and that she has had prenatal care for all of her other pregnancies at [facility].  Empathized with patients feelings and discussed with her that in previous pregnancies, she did not have DMT2.  Now that she has DMT2 she is considered "high-risk" and that she needs a different level of care for her and her baby, and that with being diabetic there are complications that can occur and  [facility] can properly manage her.  Patient verbalizes understanding.    - Urinalysis (Urine Dip)   Preterm labor symptoms and general obstetric precautions including but not limited to vaginal bleeding, contractions, leaking of fluid and fetal movement were reviewed in detail with the patient.   Please refer to  After Visit Summary for other counseling recommendations.  No follow-ups on file.   No future appointment at [facility].  Next appointment is at [facility] 12/14 & 12/16.    Wendi Snipes, FNP

## 2020-09-11 NOTE — L&D Delivery Note (Addendum)
Operative Delivery Note At 17:40 PM a viable female was delivered via Vaginal, Forceps (Long Simpsons).  Presentation: straight OP; Station: +2.  Verbal consent: obtained from patient.  Risks and benefits discussed in detail.  Risks include, but are not limited to the risks of anesthesia, bleeding, infection, damage to maternal tissues, fetal cephalhematoma.  There is also the risk of inability to effect vaginal delivery of the head, or shoulder dystocia that cannot be resolved by established maneuvers, leading to the need for emergency cesarean section.  Patient had epidural placed at 4.5cm, following epidural deceleration to 70's at 17:18  hypotension noted with BP's 61/44.  Page received 17:24 with me being at bedside within a minute.   At this point patient noted to have made rapid change to 7, AROM occurred with light meconium noted at 17:36, FSE applied.    Phenylephrine pushes administered and anesthesia at bedside.  R.  8, then complete.  BP recovered after multiple phenylephrine pushes and patient noted to be complete with what appeared to be attempt at recovery in fetal heart rate to the 120's at 17:36 before returning to 90;s. Given lack of improvement in fetal heart rate following improvement in maternal blood pressure discussed with patient proceeding with attempt at forceps delivery which would like be more expeditious than cesarean section but with the concerning being that this infant had an EFW 100g above her infant which experienced a shoulder dystocia.  Patient and husband opted for trial of forceps which was successful in achieving delivery without shoulder dystocia at 17:40.  APGAR: 4, 7; and 8 weight pending.   Placenta status:spontanous, intact.   Cord: 3VC  with the following complications:fetal intolerance of labor following profound episode of hypotension following epidural.  Cord pH: N/A Anesthesia: Epidural Instruments: Long simpson Episiotomy: None Lacerations: None Suture  Repair: N/A Est. Blood Loss (mL): 200  Mom to postpartum.  Baby to Couplet care / Skin to Skin.  Vena Austria 01/21/2021, 5:59 PM

## 2021-01-21 ENCOUNTER — Observation Stay (HOSPITAL_BASED_OUTPATIENT_CLINIC_OR_DEPARTMENT_OTHER)
Admission: EM | Admit: 2021-01-21 | Discharge: 2021-01-21 | Disposition: A | Discharge status: Home / Self Care | Payer: Medicaid Other | Source: Home / Self Care | Admitting: Obstetrics and Gynecology

## 2021-01-21 ENCOUNTER — Inpatient Hospital Stay
Admission: EM | Admit: 2021-01-21 | Discharge: 2021-01-23 | DRG: 806 | Disposition: A | Discharge status: Home / Self Care | Payer: Medicaid Other | Attending: Obstetrics and Gynecology | Admitting: Obstetrics and Gynecology

## 2021-01-21 ENCOUNTER — Encounter: Payer: Self-pay | Admitting: Obstetrics and Gynecology

## 2021-01-21 ENCOUNTER — Inpatient Hospital Stay: Payer: Medicaid Other | Admitting: Anesthesiology

## 2021-01-21 ENCOUNTER — Other Ambulatory Visit: Payer: Self-pay

## 2021-01-21 DIAGNOSIS — O9081 Anemia of the puerperium: Secondary | ICD-10-CM | POA: Diagnosis not present

## 2021-01-21 DIAGNOSIS — O99824 Streptococcus B carrier state complicating childbirth: Secondary | ICD-10-CM | POA: Diagnosis present

## 2021-01-21 DIAGNOSIS — O99214 Obesity complicating childbirth: Secondary | ICD-10-CM | POA: Diagnosis present

## 2021-01-21 DIAGNOSIS — Z641 Problems related to multiparity: Secondary | ICD-10-CM

## 2021-01-21 DIAGNOSIS — O99891 Other specified diseases and conditions complicating pregnancy: Secondary | ICD-10-CM

## 2021-01-21 DIAGNOSIS — O0943 Supervision of pregnancy with grand multiparity, third trimester: Secondary | ICD-10-CM

## 2021-01-21 DIAGNOSIS — O480 Post-term pregnancy: Secondary | ICD-10-CM

## 2021-01-21 DIAGNOSIS — D62 Acute posthemorrhagic anemia: Secondary | ICD-10-CM | POA: Diagnosis not present

## 2021-01-21 DIAGNOSIS — O471 False labor at or after 37 completed weeks of gestation: Secondary | ICD-10-CM | POA: Insufficient documentation

## 2021-01-21 DIAGNOSIS — R109 Unspecified abdominal pain: Secondary | ICD-10-CM | POA: Insufficient documentation

## 2021-01-21 DIAGNOSIS — O0990 Supervision of high risk pregnancy, unspecified, unspecified trimester: Secondary | ICD-10-CM

## 2021-01-21 DIAGNOSIS — Z3A40 40 weeks gestation of pregnancy: Secondary | ICD-10-CM

## 2021-01-21 DIAGNOSIS — O24419 Gestational diabetes mellitus in pregnancy, unspecified control: Secondary | ICD-10-CM | POA: Diagnosis present

## 2021-01-21 DIAGNOSIS — I959 Hypotension, unspecified: Secondary | ICD-10-CM | POA: Diagnosis not present

## 2021-01-21 DIAGNOSIS — E669 Obesity, unspecified: Secondary | ICD-10-CM | POA: Insufficient documentation

## 2021-01-21 DIAGNOSIS — T413X5A Adverse effect of local anesthetics, initial encounter: Secondary | ICD-10-CM | POA: Diagnosis not present

## 2021-01-21 DIAGNOSIS — O26893 Other specified pregnancy related conditions, third trimester: Secondary | ICD-10-CM | POA: Diagnosis present

## 2021-01-21 DIAGNOSIS — O24410 Gestational diabetes mellitus in pregnancy, diet controlled: Secondary | ICD-10-CM

## 2021-01-21 DIAGNOSIS — Z7984 Long term (current) use of oral hypoglycemic drugs: Secondary | ICD-10-CM | POA: Insufficient documentation

## 2021-01-21 DIAGNOSIS — O24420 Gestational diabetes mellitus in childbirth, diet controlled: Secondary | ICD-10-CM | POA: Diagnosis present

## 2021-01-21 DIAGNOSIS — Z20822 Contact with and (suspected) exposure to covid-19: Secondary | ICD-10-CM | POA: Diagnosis present

## 2021-01-21 DIAGNOSIS — O99210 Obesity complicating pregnancy, unspecified trimester: Secondary | ICD-10-CM | POA: Diagnosis present

## 2021-01-21 DIAGNOSIS — I952 Hypotension due to drugs: Secondary | ICD-10-CM | POA: Diagnosis not present

## 2021-01-21 DIAGNOSIS — O368130 Decreased fetal movements, third trimester, not applicable or unspecified: Secondary | ICD-10-CM | POA: Insufficient documentation

## 2021-01-21 DIAGNOSIS — Z8759 Personal history of other complications of pregnancy, childbirth and the puerperium: Secondary | ICD-10-CM

## 2021-01-21 DIAGNOSIS — O99213 Obesity complicating pregnancy, third trimester: Secondary | ICD-10-CM | POA: Insufficient documentation

## 2021-01-21 DIAGNOSIS — O09293 Supervision of pregnancy with other poor reproductive or obstetric history, third trimester: Secondary | ICD-10-CM

## 2021-01-21 LAB — RESP PANEL BY RT-PCR (FLU A&B, COVID) ARPGX2
Influenza A by PCR: NEGATIVE
Influenza B by PCR: NEGATIVE
SARS Coronavirus 2 by RT PCR: NEGATIVE

## 2021-01-21 LAB — CBC
HCT: 34.5 % — ABNORMAL LOW (ref 36.0–46.0)
Hemoglobin: 11.6 g/dL — ABNORMAL LOW (ref 12.0–15.0)
MCH: 25.9 pg — ABNORMAL LOW (ref 26.0–34.0)
MCHC: 33.6 g/dL (ref 30.0–36.0)
MCV: 77 fL — ABNORMAL LOW (ref 80.0–100.0)
Platelets: 324 10*3/uL (ref 150–400)
RBC: 4.48 MIL/uL (ref 3.87–5.11)
RDW: 14.6 % (ref 11.5–15.5)
WBC: 6.9 10*3/uL (ref 4.0–10.5)
nRBC: 0 % (ref 0.0–0.2)

## 2021-01-21 LAB — TYPE AND SCREEN
ABO/RH(D): B POS
Antibody Screen: NEGATIVE

## 2021-01-21 LAB — GLUCOSE, CAPILLARY: Glucose-Capillary: 73 mg/dL (ref 70–99)

## 2021-01-21 MED ORDER — ACETAMINOPHEN 325 MG PO TABS: 650.0000 mg | ORAL_TABLET | ORAL | Status: DC | PRN

## 2021-01-21 MED ORDER — LIDOCAINE HCL (PF) 1 % IJ SOLN
INTRAMUSCULAR | Status: DC | PRN
  Administered 2021-01-21: 3 mL via INTRADERMAL

## 2021-01-21 MED ORDER — IBUPROFEN 600 MG PO TABS
600.0000 mg | ORAL_TABLET | Freq: Four times a day (QID) | ORAL | Status: DC
  Administered 2021-01-22 – 2021-01-23 (×7): 600 mg via ORAL
  Filled 2021-01-21 (×7): qty 1

## 2021-01-21 MED ORDER — PHENYLEPHRINE HCL (PRESSORS) 10 MG/ML IV SOLN
INTRAVENOUS | Status: DC | PRN
  Administered 2021-01-21 (×2): 200 ug via INTRAVENOUS
  Administered 2021-01-21: 300 ug via INTRAVENOUS

## 2021-01-21 MED ORDER — SODIUM CHLORIDE 0.9 % IV SOLN
5.0000 10*6.[IU] | Freq: Once | INTRAVENOUS | Status: AC
  Administered 2021-01-21: 5 10*6.[IU] via INTRAVENOUS
  Filled 2021-01-21: qty 5

## 2021-01-21 MED ORDER — ONDANSETRON HCL 4 MG/2ML IJ SOLN: 4.0000 mg | INTRAMUSCULAR | Status: DC | PRN

## 2021-01-21 MED ORDER — BENZOCAINE-MENTHOL 20-0.5 % EX AERO
1.0000 "application " | INHALATION_SPRAY | CUTANEOUS | Status: DC | PRN
  Administered 2021-01-21 – 2021-01-22 (×2): 1 via TOPICAL
  Filled 2021-01-21 (×2): qty 56

## 2021-01-21 MED ORDER — TETANUS-DIPHTH-ACELL PERTUSSIS 5-2.5-18.5 LF-MCG/0.5 IM SUSY: 0.5000 mL | PREFILLED_SYRINGE | Freq: Once | INTRAMUSCULAR | Status: DC

## 2021-01-21 MED ORDER — FENTANYL 2.5 MCG/ML W/ROPIVACAINE 0.15% IN NS 100 ML EPIDURAL (ARMC)
12.0000 mL/h | EPIDURAL | Status: DC
  Administered 2021-01-21: 12 mL/h via EPIDURAL

## 2021-01-21 MED ORDER — PRENATAL MULTIVITAMIN CH
1.0000 | ORAL_TABLET | Freq: Every day | ORAL | Status: DC
  Administered 2021-01-22 – 2021-01-23 (×2): 1 via ORAL
  Filled 2021-01-21 (×2): qty 1

## 2021-01-21 MED ORDER — DIBUCAINE (PERIANAL) 1 % EX OINT
1.0000 "application " | TOPICAL_OINTMENT | CUTANEOUS | Status: DC | PRN
  Administered 2021-01-22: 1 via RECTAL
  Filled 2021-01-21: qty 28

## 2021-01-21 MED ORDER — DIPHENHYDRAMINE HCL 25 MG PO CAPS: 25.0000 mg | ORAL_CAPSULE | Freq: Four times a day (QID) | ORAL | Status: DC | PRN

## 2021-01-21 MED ORDER — EPHEDRINE 5 MG/ML INJ
10.0000 mg | INTRAVENOUS | Status: DC | PRN
  Filled 2021-01-21: qty 2

## 2021-01-21 MED ORDER — FENTANYL 2.5 MCG/ML W/ROPIVACAINE 0.15% IN NS 100 ML EPIDURAL (ARMC)
EPIDURAL | Status: AC
  Filled 2021-01-21: qty 100

## 2021-01-21 MED ORDER — WITCH HAZEL-GLYCERIN EX PADS
1.0000 "application " | MEDICATED_PAD | CUTANEOUS | Status: DC | PRN
  Administered 2021-01-21 – 2021-01-22 (×3): 1 via TOPICAL
  Filled 2021-01-21 (×3): qty 100

## 2021-01-21 MED ORDER — LACTATED RINGERS IV SOLN: 500.0000 mL | INTRAVENOUS | Status: DC | PRN

## 2021-01-21 MED ORDER — SIMETHICONE 80 MG PO CHEW: 80.0000 mg | CHEWABLE_TABLET | ORAL | Status: DC | PRN

## 2021-01-21 MED ORDER — PENICILLIN G POT IN DEXTROSE 60000 UNIT/ML IV SOLN
3.0000 10*6.[IU] | INTRAVENOUS | Status: DC
  Filled 2021-01-21 (×5): qty 50

## 2021-01-21 MED ORDER — OXYTOCIN-SODIUM CHLORIDE 30-0.9 UT/500ML-% IV SOLN
2.5000 [IU]/h | INTRAVENOUS | Status: DC
  Filled 2021-01-21: qty 1000

## 2021-01-21 MED ORDER — OXYTOCIN BOLUS FROM INFUSION
333.0000 mL | Freq: Once | INTRAVENOUS | Status: AC
  Administered 2021-01-21: 333 mL via INTRAVENOUS

## 2021-01-21 MED ORDER — BUPIVACAINE HCL (PF) 0.25 % IJ SOLN
INTRAMUSCULAR | Status: DC | PRN
  Administered 2021-01-21 (×2): 4 mL via EPIDURAL

## 2021-01-21 MED ORDER — OXYCODONE-ACETAMINOPHEN 5-325 MG PO TABS: 1.0000 | ORAL_TABLET | ORAL | Status: DC | PRN

## 2021-01-21 MED ORDER — BUTORPHANOL TARTRATE 1 MG/ML IJ SOLN: 1.0000 mg | INTRAMUSCULAR | Status: DC | PRN

## 2021-01-21 MED ORDER — ONDANSETRON HCL 4 MG PO TABS: 4.0000 mg | ORAL_TABLET | ORAL | Status: DC | PRN

## 2021-01-21 MED ORDER — LACTATED RINGERS IV SOLN
500.0000 mL | Freq: Once | INTRAVENOUS | Status: AC
  Administered 2021-01-21: 500 mL via INTRAVENOUS

## 2021-01-21 MED ORDER — AMMONIA AROMATIC IN INHA
RESPIRATORY_TRACT | Status: AC
  Filled 2021-01-21: qty 10

## 2021-01-21 MED ORDER — COCONUT OIL OIL
1.0000 "application " | TOPICAL_OIL | Status: DC | PRN
  Administered 2021-01-21: 1 via TOPICAL
  Filled 2021-01-21 (×2): qty 120

## 2021-01-21 MED ORDER — EPHEDRINE SULFATE 50 MG/ML IJ SOLN
INTRAMUSCULAR | Status: DC | PRN
  Administered 2021-01-21: 10 mg via INTRAVENOUS

## 2021-01-21 MED ORDER — LACTATED RINGERS IV SOLN: INTRAVENOUS | Status: DC

## 2021-01-21 MED ORDER — LIDOCAINE HCL (PF) 1 % IJ SOLN: 30.0000 mL | INTRAMUSCULAR | Status: DC | PRN

## 2021-01-21 MED ORDER — OXYTOCIN 10 UNIT/ML IJ SOLN
INTRAMUSCULAR | Status: AC
  Filled 2021-01-21: qty 2

## 2021-01-21 MED ORDER — MISOPROSTOL 200 MCG PO TABS
ORAL_TABLET | ORAL | Status: AC
  Filled 2021-01-21: qty 4

## 2021-01-21 MED ORDER — LIDOCAINE HCL (PF) 1 % IJ SOLN
INTRAMUSCULAR | Status: AC
  Filled 2021-01-21: qty 30

## 2021-01-21 MED ORDER — ACETAMINOPHEN 325 MG PO TABS
650.0000 mg | ORAL_TABLET | ORAL | Status: DC | PRN
  Administered 2021-01-21 – 2021-01-23 (×7): 650 mg via ORAL
  Filled 2021-01-21 (×7): qty 2

## 2021-01-21 MED ORDER — PHENYLEPHRINE 40 MCG/ML (10ML) SYRINGE FOR IV PUSH (FOR BLOOD PRESSURE SUPPORT)
PREFILLED_SYRINGE | INTRAVENOUS | Status: AC
  Filled 2021-01-21: qty 10

## 2021-01-21 MED ORDER — PHENYLEPHRINE 40 MCG/ML (10ML) SYRINGE FOR IV PUSH (FOR BLOOD PRESSURE SUPPORT)
80.0000 ug | PREFILLED_SYRINGE | INTRAVENOUS | Status: DC | PRN
  Administered 2021-01-21 (×2): 160 ug via INTRAVENOUS
  Filled 2021-01-21: qty 10

## 2021-01-21 MED ORDER — ONDANSETRON HCL 4 MG/2ML IJ SOLN: 4.0000 mg | Freq: Four times a day (QID) | INTRAMUSCULAR | Status: DC | PRN

## 2021-01-21 MED ORDER — DIPHENHYDRAMINE HCL 50 MG/ML IJ SOLN: 12.5000 mg | INTRAMUSCULAR | Status: DC | PRN

## 2021-01-21 MED ORDER — OXYCODONE-ACETAMINOPHEN 5-325 MG PO TABS: 2.0000 | ORAL_TABLET | ORAL | Status: DC | PRN

## 2021-01-21 MED ORDER — LIDOCAINE-EPINEPHRINE (PF) 1.5 %-1:200000 IJ SOLN
INTRAMUSCULAR | Status: DC | PRN
  Administered 2021-01-21: 3 mL via EPIDURAL

## 2021-01-21 MED ORDER — SENNOSIDES-DOCUSATE SODIUM 8.6-50 MG PO TABS
2.0000 | ORAL_TABLET | ORAL | Status: DC
  Administered 2021-01-21 – 2021-01-22 (×2): 2 via ORAL
  Filled 2021-01-21 (×2): qty 2

## 2021-01-21 MED ORDER — PHENYLEPHRINE 40 MCG/ML (10ML) SYRINGE FOR IV PUSH (FOR BLOOD PRESSURE SUPPORT)
80.0000 ug | PREFILLED_SYRINGE | INTRAVENOUS | Status: DC | PRN
  Filled 2021-01-21 (×2): qty 10

## 2021-01-21 NOTE — Discharge Summary (Addendum)
OB Discharge Summary     Patient Name: Nicole Livingston DOB: [birth year] MRN: [id number]  Date of admission: 01/21/2021 Delivering MD:    Date of discharge: 01/23/2021  Admitting diagnosis: Labor and delivery, indication for care [O75.9] Intrauterine pregnancy: [redacted]w[redacted]d    Secondary diagnosis:  Active Problems:   Labor and delivery, indication for care  Additional problems: Gestational diabetes, history of shoulder dystocia     Discharge diagnosis: Term Pregnancy Delivered                                                                                                Post partum procedures: none  Augmentation: N/A  Complications: hypotension and prolonged fetal bradycardia following epidural  Hospital course:  Onset of Labor With Vaginal Delivery      35y.o. yo [id number] admitted in Active Labor on 01/21/2021. Patient had an complicated labor course as follows:  Admitted in active labor with history of prior shoulder dystocia.  Patient had epidural placed at 4.5cm, following epidural hypotension noted with BP's 60/30.  At this point patient noted to have made rapid change to 6, 8, then complete.  BP recovered after multiple phenylephrine pushes.  Ruptured occurred during check and FSE placed.  There was no recover in fetal heart rate following improvement in maternal blood pressure.  Light meconium was noted at the time of membrane rupture.  Discussed with patient proceeding with attempt at forceps delivery which would like be more expeditious than cesarean section but with the concerning being that this infant had an EFW 100g above her infant which experienced a shoulder dystocia.  Patient and husband opted for trial of forceps which was successful in achieving delivery without evidence of shoulder dystocia.   Delivery Method:Vaginal, Forceps  Episiotomy: None  Lacerations:  None  Patient had an uncomplicated postpartum course.  She is ambulating, tolerating a regular diet,  passing flatus, and urinating well. Patient is discharged home in stable condition on 01/21/21.  Newborn Data: Birth date:01/21/2021  Birth time:3:40 PM  Gender:  Living status:Living  Apgars: ,  Weight:   Physical exam  Vitals:   01/21/21 1733 01/21/21 1739 01/21/21 1744 01/21/21 1749  BP: 96/72 (!) 138/115 (!) 121/57 113/73  Pulse: 72 (!) 116 78 (!) 132  Resp:      Temp:      TempSrc:      SpO2:    99%  Weight:      Height:       General: alert, cooperative and no distress Lochia: appropriate Uterine Fundus: firm Incision: NA DVT Evaluation: No evidence of DVT seen on physical exam. Labs: Lab Results  Component Value Date   WBC 6.9 01/21/2021   HGB 11.6 (L) 01/21/2021   HCT 34.5 (L) 01/21/2021   MCV 77.0 (L) 01/21/2021   PLT 324 01/21/2021   CMP Latest Ref Rng & Units 07/13/2020  Glucose 65 - 99 mg/dL 155(H)  BUN 6 - 20 mg/dL 8  Creatinine 0.57 - 1.00 mg/dL 0.53(L)  Sodium 134 - 144 mmol/L 132(L)  Potassium 3.5 - 5.2 mmol/L 4.1  Chloride 96 - 106 mmol/L 100  CO2 20 - 29 mmol/L 22  Calcium 8.7 - 10.2 mg/dL 9.1  Total Protein 6.0 - 8.5 g/dL 7.2  Total Bilirubin 0.0 - 1.2 mg/dL <0.2  Alkaline Phos 44 - 121 IU/L 70  AST 0 - 40 IU/L 13  ALT 0 - 32 IU/L 18    Discharge instruction: per After Visit Summary and "Baby and Me Booklet".  After visit meds:  Allergies as of 01/23/2021   No Known Allergies     Medication List    TAKE these medications   Accu-Chek Softclix Lancets lancets Use as instructed   blood glucose meter kit and supplies Kit Dispense based on patient and insurance preference. Use up to four times daily as directed. (FOR ICD-9 250.00, 250.01).   famotidine 20 MG tablet Commonly known as: PEPCID Take 20 mg by mouth 2 (two) times daily.   glucose blood test strip Use as instructed   ibuprofen 600 MG tablet Commonly known as: ADVIL Take 1 tablet (600 mg total) by mouth every 6 (six) hours as needed.   omeprazole 10 MG  capsule Commonly known as: PRILOSEC Take 10 mg by mouth daily.   PRENATAL VITAMIN PO Take 1 tablet by mouth daily.            Discharge Care Instructions  (From admission, onward)         Start     Ordered   01/23/21 0000  Discharge wound care:       Comments: SHOWER DAILY Wash incision gently with soap and water.  Call office with any drainage, redness, or firmness of the incision.   01/23/21 1109          Diet: carb modified diet  Activity: Advance as tolerated. Pelvic rest for 6 weeks.   Outpatient follow up:2 weeks Follow up Appt:No future appointments. Follow up Visit:No follow-ups on file.  Postpartum contraception: Combination OCPs and Condoms  Newborn Data: Live born child  Birth Weight:   APGAR: ,   Newborn Delivery   Birth date/time: 01/21/2021 15:40:00 Delivery type: Vaginal, Forceps      Baby Feeding: Bottle and Breast Disposition:home with mother   01/21/2021 Malachy Mood, MD

## 2021-01-21 NOTE — Progress Notes (Signed)
Pt to be d/c home and to self care. Pt given teaching on when to seek medical attention (LOF, VB and decreased FM, etc..). Pt verbalized understanding of d/c instructions.   ?

## 2021-01-21 NOTE — Final Progress Note (Addendum)
Physician Final Progress Note  Patient ID: Nicole Livingston MRN: [id number] DOB/AGE: Oct 17, 1985 35 y.o.  Admit date: 01/21/2021 Admitting provider: Will Bonnet, MD Discharge date: 01/21/2021   Admission Diagnoses:  1) intrauterine pregnancy at [redacted]w[redacted]d 2) abdominal pain in pregnancy, third trimester  3) Gestational diabetes, third trimester, diet controlled 4) Obesity in pregnancy, third trimester 5) Decreased fetal movement in third trimester  Discharge Diagnoses:  Principal Problem:   Supervision of high risk pregnancy, antepartum Active Problems:   History of shoulder dystocia in prior pregnancy 05/2014 (7#14)   History of postpartum hemorrhage 500 cc 05/2014   Grand multipara G5P4   Obesity affecting pregnancy BMI 42.2   GDM@13wk  (A/B GDM)   Abdominal pain during pregnancy in third trimester   [redacted] weeks gestation of pregnancy  False labor  History of Present Illness: The patient is a 35y.o. female G5P4004 at 415w1dho presents for irregular uterine contractions, present this morning occurring every 15-20 minutes, described as cramping.  She had a precipitous delivery wit her last baby and wanted to be sure she was not in active labor.  Her pregnancy is dated by a 14 week ultrasound. Her pregnancy has been complicated a diet controlled gestational diabetes.  She also has had a pregnancy complicated by a history of shoulder dystocia with G3, weight 7 lb 14 oz, with a postpartum hemorrhage.  With G4 she did not have a shoulder dystocia. That baby weighed 7 lb 2 oz.  She was originally scheduled to have a c-section at 39 weeks. She presented and was found to be 2 cm.  She decided to try to forgo a c-section. She states she is not currently scheduled for a follow up visit. She notes no vaginal bleeding or leaking fluid. She has felt decreased fetal movement, though she does feel her baby move.  Blood type B+ Rubella - Immune Varicella - Immune HIV/RPR - NR Hepatitis C screen -  negative Pap smear - 07/2020 NILM, HPV negative GBS + on urine culture in 10/2020 Quad screen: negative at [facility] TDaP - 10/28/2020 Flu shot: 07/26/2020 Last growth u/s: 01/07/2021 - EFW 3,239 Grams 46%ile, AC 63rd %ile.      Past Medical History:  Diagnosis Date  . Anemia   . Gestational diabetes    diet controlled    Past Surgical History:  Procedure Laterality Date  . denies    . KNEE SURGERY Right    'A51ound 8 76ears old after injury in home"    No current facility-administered medications on file prior to encounter.   Current Outpatient Medications on File Prior to Encounter  Medication Sig Dispense Refill  . Prenatal Vit-Fe Fumarate-FA (PRENATAL VITAMIN PO) Take 1 tablet by mouth daily.    . Accu-Chek Softclix Lancets lancets Use as instructed (Patient not taking: Reported on 07/29/2020) 100 each 12  . blood glucose meter kit and supplies KIT Dispense based on patient and insurance preference. Use up to four times daily as directed. (FOR ICD-9 250.00, 250.01). (Patient not taking: Reported on 08/19/2020) 1 each 0  . famotidine (PEPCID) 20 MG tablet Take 20 mg by mouth 2 (two) times daily.    . Marland Kitchenlucose blood test strip Use as instructed 100 each 12    No Known Allergies  Social History   Socioeconomic History  . Marital status: Married    Spouse name: OcOlga Coaster. Number of children: 4  . Years of education: 1255. Highest education level: 12th grade  Occupational  History  . Not on file  Tobacco Use  . Smoking status: Never Smoker  . Smokeless tobacco: Never Used  . Tobacco comment: no passive exposure  Vaping Use  . Vaping Use: Never used  Substance and Sexual Activity  . Alcohol use: Not Currently    Comment: last use a couple of years ago  . Drug use: Never  . Sexual activity: Yes    Birth control/protection: Condom, Pill  Other Topics Concern  . Not on file  Social History Narrative  . Not on file   Social Determinants of Health   Financial Resource  Strain: Low Risk   . Difficulty of Paying Living Expenses: Not very hard  Food Insecurity: No Food Insecurity  . Worried About Charity fundraiser in the Last Year: Never true  . Ran Out of Food in the Last Year: Never true  Transportation Needs: No Transportation Needs  . Lack of Transportation (Medical): No  . Lack of Transportation (Non-Medical): No  Physical Activity: Not on file  Stress: Not on file  Social Connections: Not on file  Intimate Partner Violence: Not At Risk  . Fear of Current or Ex-Partner: No  . Emotionally Abused: No  . Physically Abused: No  . Sexually Abused: No    Family History  Problem Relation Age of Onset  . Diabetes Father   . Diabetes Paternal Grandmother   . Diabetes Paternal Grandfather      Review of Systems  Constitutional: Negative.   HENT: Negative.   Eyes: Negative.   Respiratory: Negative.   Cardiovascular: Negative.   Gastrointestinal: Positive for abdominal pain (see HPI (contractions/cramping)).  Genitourinary: Negative.   Musculoskeletal: Negative.   Skin: Negative.   Neurological: Negative.   Psychiatric/Behavioral: Negative.      Physical Exam: BP 123/71 (BP Location: Left Arm)   Pulse 78   Temp 98.6 F (37 C) (Oral)   Resp 14   LMP 01/27/2020 (Approximate) Comment: normal period but has irregular menses  Physical Exam Constitutional:      General: She is not in acute distress.    Appearance: Normal appearance. She is well-developed.  Genitourinary:     Genitourinary Comments: SVE: 2.5/60/-2 per RN  HENT:     Head: Normocephalic and atraumatic.  Eyes:     General: No scleral icterus.    Conjunctiva/sclera: Conjunctivae normal.  Cardiovascular:     Rate and Rhythm: Normal rate and regular rhythm.     Heart sounds: No murmur heard. No friction rub. No gallop.   Pulmonary:     Effort: Pulmonary effort is normal. No respiratory distress.     Breath sounds: Normal breath sounds. No wheezing or rales.  Abdominal:      General: Bowel sounds are normal. There is no distension.     Palpations: Abdomen is soft. There is mass (Gravid, NT).     Tenderness: There is no abdominal tenderness. There is no guarding or rebound.  Musculoskeletal:        General: Normal range of motion.     Cervical back: Normal range of motion and neck supple.  Neurological:     General: No focal deficit present.     Mental Status: She is alert and oriented to person, place, and time.     Cranial Nerves: No cranial nerve deficit.  Skin:    General: Skin is warm and dry.     Findings: No erythema.  Psychiatric:        Mood and Affect:  Mood normal.        Behavior: Behavior normal.        Judgment: Judgment normal.     Consults: None  Significant Findings/ Diagnostic Studies: None  Procedures:  NST: Baseline FHR: 130 beats/min Variability: moderate Accelerations: present Decelerations: absent Tocometry: irregular, infrequent  Interpretation:  INDICATIONS: decreased fetal movement and rule out uterine contractions RESULTS:  A NST procedure was performed with FHR monitoring and a normal baseline established, appropriate time of 20-40 minutes of evaluation, and accels >2 seen w 15x15 characteristics.  Results show a REACTIVE NST.    Hospital Course: The patient was admitted to Labor and Delivery Triage for observation. She had normal vital signs. Her NST was reactive. She had a cervical exam by the RN who found her cervix to be about 2.5 cm, as above. Her exam last week was about 2 cm, on 5/6.  Given that she did not appear to be laboring, she was discharged with reassurance.  She was offered a longer monitoring period which she declined.  She was encourage to contact [facility] to discuss clinical follow up and delivery planning as it has been a week since she was to have a scheduled primary c-section with them and that she had gestational diabetes, which requires close follow up.  She voiced understanding and agreement. All  questions answered.   Discharge Condition: stable  Disposition: Discharge disposition: 01-Home or Self Care       Diet: Diabetic diet  Discharge Activity: Activity as tolerated   Allergies as of 01/21/2021   No Known Allergies     Medication List    STOP taking these medications   acetaminophen 500 MG tablet Commonly known as: TYLENOL   alum & mag hydroxide-simeth 200-200-20 MG/5ML suspension Commonly known as: MAALOX/MYLANTA   ibuprofen 600 MG tablet Commonly known as: ADVIL     TAKE these medications   Accu-Chek Softclix Lancets lancets Use as instructed   blood glucose meter kit and supplies Kit Dispense based on patient and insurance preference. Use up to four times daily as directed. (FOR ICD-9 250.00, 250.01).   famotidine 20 MG tablet Commonly known as: PEPCID Take 20 mg by mouth 2 (two) times daily.   glucose blood test strip Use as instructed   PRENATAL VITAMIN PO Take 1 tablet by mouth daily.       Follow-up Information    [place], [place]. Call.   Why: Call [facility] to determine follow up and next steps Contact information: [facility] [place] [address] [phone]               Total time spent taking care of this patient: 35 minutes. I spent time in reviewing her chart and notes from [facility], as well as in direct discussion with her regarding her management options and documenting the encounter.   Signed: Prentice Docker, MD  01/21/2021, 3:39 AM

## 2021-01-21 NOTE — Anesthesia Procedure Notes (Signed)
Epidural Patient location during procedure: OB  Staffing Anesthesiologist: Lenard Simmer, MD Resident/CRNA: Mathews Argyle, CRNA Performed: resident/CRNA   Preanesthetic Checklist Completed: patient identified, IV checked, site marked, risks and benefits discussed, surgical consent, monitors and equipment checked, pre-op evaluation and timeout performed  Epidural Patient position: sitting Prep: ChloraPrep and site prepped and draped Patient monitoring: heart rate, continuous pulse ox and blood pressure Approach: midline Location: L4-L5 Injection technique: LOR saline  Needle:  Needle type: Tuohy  Needle gauge: 17 G Needle length: 9 cm and 9 Needle insertion depth: 7 cm Catheter type: closed end flexible Catheter size: 19 Gauge Catheter at skin depth: 11 cm Test dose: negative and 1.5% lidocaine with Epi 1:200 K  Assessment Events: blood not aspirated, injection not painful, no injection resistance, no paresthesia and negative IV test  Additional Notes   Patient tolerated the insertion well without complications.Reason for block:procedure for pain

## 2021-01-21 NOTE — OB Triage Note (Signed)
Pt is a 34y/o G5P4 at [redacted]w[redacted]d with c/o cramping all week that has caused difficulty walking and feels like the baby is really low. Pt states decreased fetal movement this week but felt some movement today. Pt denies LOF and VB. Pt states some mucus pink discharge all week. Pt receives care at [facility]. Monitors applied and assessing. Initial FHT 135.

## 2021-01-21 NOTE — Anesthesia Preprocedure Evaluation (Signed)
Anesthesia Evaluation  Patient identified by MRN, date of birth, ID band Patient awake    Reviewed: Allergy & Precautions, H&P , NPO status , Patient's Chart, lab work & pertinent test results  Airway Mallampati: III  TM Distance: >3 FB Neck ROM: full    Dental  (+) Dental Advidsory Given   Pulmonary neg pulmonary ROS,           Cardiovascular      Neuro/Psych negative neurological ROS  negative psych ROS   GI/Hepatic GERD  ,  Endo/Other  diabetes  Renal/GU   negative genitourinary   Musculoskeletal   Abdominal   Peds  Hematology  (+) Blood dyscrasia, anemia ,   Anesthesia Other Findings   Reproductive/Obstetrics (+) Pregnancy                             Anesthesia Physical Anesthesia Plan  ASA: II  Anesthesia Plan: Epidural   Post-op Pain Management:    Induction:   PONV Risk Score and Plan:   Airway Management Planned:   Additional Equipment:   Intra-op Plan:   Post-operative Plan:   Informed Consent: I have reviewed the patients History and Physical, chart, labs and discussed the procedure including the risks, benefits and alternatives for the proposed anesthesia with the patient or authorized representative who has indicated his/her understanding and acceptance.       Plan Discussed with: CRNA  Anesthesia Plan Comments:         Anesthesia Quick Evaluation

## 2021-01-21 NOTE — Discharge Summary (Signed)
See Final Progress Note 

## 2021-01-21 NOTE — OB Triage Note (Signed)
Patient is a G5P4 [redacted]w[redacted]d that presents with contractions that started at 6am that she rates 8/10. Patient denies any changes in bleeding and no LOF. +FM. Monitors applied and assessing.

## 2021-01-21 NOTE — H&P (Signed)
OB History & Physical   History of Present Illness:  Chief Complaint: contractions  HPI:  Torah Pinnock is a 35 y.o. G30P4004 female at 24w1ddated by 14 week ultrasound.  Her pregnancy has been complicated by obesity bmi greater than 40, history of shoulder dystocia and postpartum hemorrhage (500 ml) both in 2015 delivery, low lying placenta resolved, gestational diabetes diagnosed at 13 weeks- possible T2 DM, grand multipara.    She reports contractions since around 6 AM today.   She denies leakage of fluid.   She denies vaginal bleeding.   She reports fetal movement.    She reports overall good control of blood sugar with most values normal and occasional elevated depending on what she eats. [facility] She [facility].  Total weight gain for pregnancy: 2.268 kg   Obstetrical Problem List: Pregnancy 2021 Problems (from 07/13/20 to present)    Problem Noted Resolved   GDM@13wk  (A/B GDM) 07/21/2020 by SHerbie Saxon CNM No   Overview Addendum 08/23/2020  4:23 PM by NCaren Macadam MD    Current Diabetic Medications:  None  [x ]ordered meter, strips, lancets- Date: 07/21/20  Diabetic Teaching for A1GDM or A2GDM: [x ] [facility]  [ ]  [facility] DM clinic [ ]  WIC MNT   Baseline and surveillance labs [facility])  Lab Results  Component Value Date   CREATININE 0.53 (L) 07/13/2020   AST 13 07/13/2020   ALT 18 07/13/2020   TSH 1.170 07/13/2020   Lab Results  Component Value Date   HGBA1C 6.2 (H) 07/13/2020    Antenatal Testing Class of DM U/S NST/AFI DELIVERY  Diabetes   A1 - good control - O24.410    A2 - good control - O24.419      A2  - poor control or poor compliance - O24.419, E11.65   (Macrosomia or polyhydramnios) **E11.65 is extra code for poor control**      20-36  20-36  20-24-28-32-36     40  32//2 x wk  32//2 x  wk     40  39  PRN      [ ]  referred for XX week [place] - (insert date) [ ]  Results reviewed and EFW=   Postpartum:  [ ]  2 hr GTT needed [ ]  Counsel patient on increased lifetime risk of diabetes and recommend regular DM screeing       Previous Version   Supervision of high risk pregnancy, antepartum 07/13/2020 by SHerbie Saxon CNM No   Overview Addendum 08/27/2020  2:17 PM by SKandee Keen PA-C     Nursing Staff Provider  Office Location  [facility] Dating    Language  English Anatomy [place] Low lying placenta at 07/27/20 u/s  Flu Vaccine  07/26/20  Genetic Screen  Quad: neg per 11/29 sample  TDaP vaccine    Hgb A1C or  GTT Early  Third trimester   Rhogam     LAB RESULTS   Feeding Plan  breast Blood Type   B positive  Contraception  ocps Antibody  negative,  07/13/2020  Circumcision  Rubella    [facility] RPR   non-reactive,    07/13/2020  Support Person  HBsAg   negative,      07/13/2020  Prenatal Classes  HIV  non-reactive,    07/13/2020  @28wk -  [facility] referral?  Varicella No results found for: VZVIGG]    HCV  Negative,   07/13/2020  BTL Consent  GBS  (For PCN allergy, check sensitivities)        VBAC Consent  Pap      Hgb Electro    BP Cuff ordered  CF   Delivery Group  [facility]   Centering Group  [facility] involved          Previous Version   Elevated glucose level 07/14/2020 by Jenetta Downer, RN 07/26/2020 by Caren Macadam, MD   Overview Addendum 07/14/2020  6:13 PM by Herbie Saxon, CNM    1 hour gtt on 07/13/2020= 146; HgbA1C=6.2  [ ] Needs 3 hour gtt      Previous Version       Maternal Medical History:   Past Medical History:  Diagnosis Date  . Anemia   . Gestational diabetes    diet controlled    Past Surgical History:  Procedure Laterality Date  . denies    . KNEE SURGERY Right    [birth year]Around 35 years old after injury in home"    No Known Allergies  Prior to Admission medications   Medication  Sig Start Date End Date Taking? Authorizing Provider  Accu-Chek Softclix Lancets lancets Use as instructed Patient not taking: No sig reported 07/21/20   Herbie Saxon, CNM  blood glucose meter kit and supplies KIT Dispense based on patient and insurance preference. Use up to four times daily as directed. (FOR ICD-9 250.00, 250.01). Patient not taking: No sig reported 07/21/20   Herbie Saxon, CNM  famotidine (PEPCID) 20 MG tablet Take 20 mg by mouth 2 (two) times daily. Patient not taking: No sig reported    [provider]  glucose blood test strip Use as instructed 07/21/20   Sciora, Real Cons, CNM  omeprazole (PRILOSEC) 10 MG capsule Take 10 mg by mouth daily.    [provider]  Prenatal Vit-Fe Fumarate-FA (PRENATAL VITAMIN PO) Take 1 tablet by mouth daily.    [provider]    OB History  Gravida Para Term Preterm AB Living  5 4 4  0 0 4  SAB IAB Ectopic Multiple Live Births  0 0 0 0 4    # Outcome Date GA Lbr Len/2nd Weight Sex Delivery Anes PTL Lv  5 Current           4 Term 10/27/17 [redacted]w[redacted]d 3240 g F Vag-Spont None  LIV  3 Term 06/06/14 [id number] g M Vag-Spont   LIV     Birth Comments: pp hemorrhage     Complications: Shoulder Dystocia  2 Term 06/08/09 [id number] g M Vag-Spont   LIV  1 Term 07/18/06   2948 g M Vag-Spont None  LIV     Birth Comments: IOL for PROM    Obstetric Comments  IOL after PROM    Prenatal care site: [facility] (T2 DM per [facility] note)  Social History: She  reports that she has never smoked. She has never used smokeless tobacco. She reports previous alcohol use. She reports that she does not use drugs.  Family History: family history includes Diabetes in her father, paternal grandfather, and paternal grandmother.   Review of Systems:  Review of Systems  Constitutional: Negative for chills and fever.  HENT: Negative for congestion, ear discharge, ear  pain, hearing loss, sinus pain and sore  throat.   Eyes: Negative for blurred vision and double vision.  Respiratory: Negative for cough, shortness of breath and wheezing.   Cardiovascular: Negative for chest pain, palpitations and leg swelling.  Gastrointestinal: Positive for abdominal pain. Negative for blood in stool, constipation, diarrhea, heartburn, melena, nausea and vomiting.  Genitourinary: Negative for dysuria, flank pain, frequency, hematuria and urgency.  Musculoskeletal: Negative for back pain, joint pain and myalgias.  Skin: Negative for itching and rash.  Neurological: Negative for dizziness, tingling, tremors, sensory change, speech change, focal weakness, seizures, loss of consciousness, weakness and headaches.  Endo/Heme/Allergies: Negative for environmental allergies. Does not bruise/bleed easily.  Psychiatric/Behavioral: Negative for depression, hallucinations, memory loss, substance abuse and suicidal ideas. The patient is not nervous/anxious and does not have insomnia.      Physical Exam:  Ht 5' (1.524 m)   Wt 94.3 kg   LMP 01/27/2020 (Approximate) Comment: normal period but has irregular menses  BMI 40.62 kg/m   Constitutional: Well nourished, well developed female in no acute distress.  HEENT: normal Skin: Warm and dry.  Cardiovascular: Regular rate and rhythm.   Extremity: no edema  Respiratory: Clear to auscultation bilateral. Normal respiratory effort Abdomen: FHT present Back: no CVAT Neuro: DTRs 2+, Cranial nerves grossly intact Psych: Alert and Oriented x3. No memory deficits. Normal mood and affect.  MS: normal gait, normal bilateral lower extremity ROM/strength/stability.  Pelvic exam: per RN J. Lunsford 5/80/-2, BBOW   Baseline FHR: 135 beats/min   Variability: moderate   Accelerations: present   Decelerations: absent Contractions: present frequency: every 2-4 Overall assessment: reassuring   No results found for: SARSCOV2NAA] Current covid  result pending   Assessment:  Ekaterina Denise is a 35 y.o. G72P4004 female at 46w1dwith active labor, intact membranes, GBS positive, GDMA1 vs DM T2.   Plan:  1. Admit to Labor & Delivery  2. CBC, T&S, Clrs, IVF 3. GBS positive in urine: prophylaxis with penicillin, delay AROM.   4. Fetal well-being: category I 5. GDM A1: blood glucose testing q 2 hours in active labor 6. Expectant management for vaginal delivery   JRod Can [facility]5/13/2022 4:19 PM

## 2021-01-22 LAB — GLUCOSE, CAPILLARY
Glucose-Capillary: 117 mg/dL — ABNORMAL HIGH (ref 70–99)
Glucose-Capillary: 125 mg/dL — ABNORMAL HIGH (ref 70–99)

## 2021-01-22 LAB — CBC
HCT: 30.2 % — ABNORMAL LOW (ref 36.0–46.0)
Hemoglobin: 10 g/dL — ABNORMAL LOW (ref 12.0–15.0)
MCH: 26 pg (ref 26.0–34.0)
MCHC: 33.1 g/dL (ref 30.0–36.0)
MCV: 78.4 fL — ABNORMAL LOW (ref 80.0–100.0)
Platelets: 247 10*3/uL (ref 150–400)
RBC: 3.85 MIL/uL — ABNORMAL LOW (ref 3.87–5.11)
RDW: 14.7 % (ref 11.5–15.5)
WBC: 9.2 10*3/uL (ref 4.0–10.5)
nRBC: 0 % (ref 0.0–0.2)

## 2021-01-22 LAB — RPR: RPR Ser Ql: NONREACTIVE

## 2021-01-22 MED ORDER — CALCIUM CARBONATE ANTACID 500 MG PO CHEW
1.0000 | CHEWABLE_TABLET | Freq: Three times a day (TID) | ORAL | Status: DC | PRN
  Administered 2021-01-22 (×2): 200 mg via ORAL
  Filled 2021-01-22: qty 1

## 2021-01-22 NOTE — Lactation Note (Signed)
This note was copied from a baby's chart. Lactation Consultation Note  Patient Name: Boy Analyce Tavares JFHLK'T Date: 01/22/2021 Reason for consult: Initial assessment;Mother's request;Term;Other (Comment) (Mother requested DEBP to stimulate breasts & for supplementation) Age:33 hours  Mom requesting DEBP be set up in room for stimulation and supplementation.  Mom had been putting Noah to the each breast for 5 to 10 minutes and then FOB had been giving formula via bottle afterward as large a volume as 60 ml.  When asked mom why she felt the need to give formula after breast feeding, she replied that [place] just kept acting hungry.  Anette Riedel has been spitting.  Explained newborn stomach size stressing that some babies just like to suck and that does not necessarily mean they are hungry.  Mom had already given Anette Riedel a pacifier.  Discussed supply and demand and need for frequent breast feeding to bring in the mature milk, ensure a plentiful milk supply and prevent engorgement.  Symphony set up in room with manual and electric use demonstrated. Reviewed warmth, breast massage, hand expression, pumping, collection, storage, cleaning and handling of expressed milk.  Coconut oil has been given with instruction in use along flange of pump and on nipples.  Mom got a few drops in the flange of the pump.  After we had pumped for about 10 minutes, Noah started licking out his tongue and rooting.  Feeding cues were pointed out to mom and she was willing to put him to the breast.  He latched to the breast with minimal assistance and began strong, rhythmic sucking for 20 minutes before falling asleep.  Encouraged mom to put Anette Riedel to the breast whenever he demonstrated feeding cues.  Mom reports having a low grade Elvie pump at home which she purchased.  Mom did not breast feed her first baby.  With her second baby she did not feel like she had enough milk and was giving breast and formula.  She breast fed her last 2 for 2 years, but  reports being unable to get them to take a bottle so she did not want that to happen this time.  Hand out given on what to expect with breast feeding the first 4 days of life reviewing importance of first milk which is called colostrum, normal course of lactation, adequate intake and out put and routine newborn feeding patterns.  Lactation [employer] given and reviewed virtual support groups available, informative web sites and contact numbers.  Lactation name and number written on white board and encouraged to call with any questions, concerns or assistance. Maternal Data Has patient been taught Hand Expression?: Yes Does the patient have breastfeeding experience prior to this delivery?: Yes How long did the patient breastfeed?: Breast fed last 2 babies for 2 years  Feeding Mother's Current Feeding Choice: Breast Milk and Formula  LATCH Score Latch: Grasps breast easily, tongue down, lips flanged, rhythmical sucking.  Audible Swallowing: None  Type of Nipple: Everted at rest and after stimulation  Comfort (Breast/Nipple): Soft / non-tender  Hold (Positioning): Assistance needed to correctly position infant at breast and maintain latch. (This was first time mom used boppy pillow)  LATCH Score: 7   Lactation Tools Discussed/Used Tools: Pump;Coconut oil;Other (comment) (Boppy pillow) Breast pump type: Double-Electric Breast Pump (Symphony set up in room per mom's request) Pump Education: Setup, frequency, and cleaning;Milk Storage;Other (comment) Reason for Pumping: Stimulation and supplementation Pumping frequency:  (As often as mom wants for extra stimulation) Pumped volume:  (Expressed  a few drops found on flange of pump - encouraged mom to rub on nipples)  Interventions Interventions: Breast feeding basics reviewed;Assisted with latch;Skin to skin;Breast massage;Hand express;Pre-pump if needed;Breast compression;Adjust position;Support pillows;Position options;Coconut  oil;DEBP;Hand pump;Education  Discharge Discharge Education: Engorgement and breast care;Warning signs for feeding baby;Other (comment) Pump: Manual;DEBP;Personal (Mom reports having small Elvie at home & Symphony DEBP set up in room pre mom's request)  Consult Status Consult Status: PRN    Louis Meckel 01/22/2021, 1:21 PM

## 2021-01-22 NOTE — Discharge Instructions (Signed)

## 2021-01-22 NOTE — Progress Notes (Signed)
Subjective:  She is feeling well. Pain control is adequate. Voiding without difficulty. Tolerating a regular diet. Ambulating well.  Objective:   Blood pressure 115/74, pulse 70, temperature (!) 97.3 F (36.3 C), temperature source Oral, resp. rate 18, height 5' (1.524 m), weight 94.3 kg, last menstrual period 01/27/2020, SpO2 100 %, unknown if currently breastfeeding.  General: NAD Pulmonary: no increased work of breathing Abdomen: non-distended, non-tender Uterus:  fundus firm at U; lochia normal Extremities: no edema, no erythema, no tenderness, no signs of DVT  Results for orders placed or performed during the hospital encounter of 01/21/21 (from the past 72 hour(s))  Resp Panel by RT-PCR (Flu A&B, Covid) Nasopharyngeal Swab     Status: None   Collection Time: 01/21/21  3:26 PM   Specimen: Nasopharyngeal Swab; Nasopharyngeal(NP) swabs in vial transport medium  Result Value Ref Range   SARS Coronavirus 2 by RT PCR NEGATIVE NEGATIVE    Comment: (NOTE) SARS-CoV-2 target nucleic acids are NOT DETECTED.  The SARS-CoV-2 RNA is generally detectable in upper respiratory specimens during the acute phase of infection. The lowest concentration of SARS-CoV-2 viral copies this assay can detect is 138 copies/mL. A negative result does not preclude SARS-Cov-2 infection and should not be used as the sole basis for treatment or other patient management decisions. A negative result may occur with  improper specimen collection/handling, submission of specimen other than nasopharyngeal swab, presence of viral mutation(s) within the areas targeted by this assay, and inadequate number of viral copies(<138 copies/mL). A negative result must be combined with clinical observations, patient history, and epidemiological information. The expected result is Negative.  Fact Sheet for Patients:  BloggerCourse.com  Fact Sheet for Healthcare Providers:   SeriousBroker.it  This test is no t yet approved or cleared by the [place] FDA and  has been authorized for detection and/or diagnosis of SARS-CoV-2 by FDA under an Emergency Use Authorization (EUA). This EUA will remain  in effect (meaning this test can be used) for the duration of the COVID-19 declaration under Section 564(b)(1) of the Act, 21 U.S.C.section 360bbb-3(b)(1), unless the authorization is terminated  or revoked sooner.       Influenza A by PCR NEGATIVE NEGATIVE   Influenza B by PCR NEGATIVE NEGATIVE    Comment: (NOTE) The Xpert Xpress SARS-CoV-2/FLU/RSV plus assay is intended as an aid in the diagnosis of influenza from Nasopharyngeal swab specimens and should not be used as a sole basis for treatment. Nasal washings and aspirates are unacceptable for Xpert Xpress SARS-CoV-2/FLU/RSV testing.  Fact Sheet for Patients: BloggerCourse.com  Fact Sheet for Healthcare Providers: SeriousBroker.it  This test is not yet approved or cleared by the [place] FDA and has been authorized for detection and/or diagnosis of SARS-CoV-2 by FDA under an Emergency Use Authorization (EUA). This EUA will remain in effect (meaning this test can be used) for the duration of the COVID-19 declaration under Section 564(b)(1) of the Act, 21 U.S.C. section 360bbb-3(b)(1), unless the authorization is terminated or revoked.  Performed at [facility], [address] [place], [place] [address]   CBC     Status: Abnormal   Collection Time: 01/21/21  4:01 PM  Result Value Ref Range   WBC 6.9 4.0 - 10.5 K/uL   RBC 4.48 3.87 - 5.11 MIL/uL   Hemoglobin 11.6 (L) 12.0 - 15.0 g/dL   HCT 44.3 (L) 15.4 - 00.8 %   MCV 77.0 (L) 80.0 - 100.0 fL   MCH 25.9 (L) 26.0 - 34.0  pg   MCHC 33.6 30.0 - 36.0 g/dL   RDW 56.4 33.2 - 95.1 %   Platelets 324 150 - 400 K/uL   nRBC 0.0 0.0 - 0.2 %    Comment: Performed  at [facility], [address] [place], [place] [address]  Type and screen [facility]     Status: None   Collection Time: 01/21/21  4:01 PM  Result Value Ref Range   ABO/RH(D) B POS    Antibody Screen NEG    Sample Expiration      01/24/2021,2359 Performed at [facility], [address] [place], [place] [address]   Glucose, capillary     Status: None   Collection Time: 01/21/21  4:08 PM  Result Value Ref Range   Glucose-Capillary 73 70 - 99 mg/dL    Comment: Glucose reference range applies only to samples taken after fasting for at least 8 hours.  CBC     Status: Abnormal   Collection Time: 01/22/21  5:28 AM  Result Value Ref Range   WBC 9.2 4.0 - 10.5 K/uL   RBC 3.85 (L) 3.87 - 5.11 MIL/uL   Hemoglobin 10.0 (L) 12.0 - 15.0 g/dL   HCT 16.0 (L) 10.9 - 32.3 %   MCV 78.4 (L) 80.0 - 100.0 fL   MCH 26.0 26.0 - 34.0 pg   MCHC 33.1 30.0 - 36.0 g/dL   RDW 55.7 32.2 - 02.5 %   Platelets 247 150 - 400 K/uL   nRBC 0.0 0.0 - 0.2 %    Comment: Performed at [facility], [address] [place], [place] [address]    Assessment:   35 y.o. [id number] postpartum day # 1  Plan:   1) Acute blood loss anemia - hemodynamically stable and asymptomatic - po ferrous sulfate  2) Blood Type --/--/B POS (05/13 1601)   3) Rubella   / Varicella- status unknown  4) TDAP status - needed, ordered Immunization History  Administered Date(s) Administered  . Influenza-Unspecified 07/26/2020    5) Feeding- bottle/breast- reports low supply, encouraged breast pumping to help with establishing the milk supply.   6) Contraception - OCP plan to start a 6 weeks postpartum  7) Disposition - continue care  8) T2DM- check blood glucose with meals. If needed will start ISS.   Nicole Idler MD [facility], [facility] 01/22/2021 10:07 AM

## 2021-01-22 NOTE — Progress Notes (Signed)
Patient forgot to call for CBG before eating dinner

## 2021-01-23 ENCOUNTER — Ambulatory Visit: Payer: Self-pay

## 2021-01-23 LAB — GLUCOSE, CAPILLARY: Glucose-Capillary: 88 mg/dL (ref 70–99)

## 2021-01-23 MED ORDER — IBUPROFEN 600 MG PO TABS: 600.0000 mg | ORAL_TABLET | Freq: Four times a day (QID) | ORAL | 0 refills | Status: AC | PRN

## 2021-01-23 NOTE — Progress Notes (Signed)
Subjective:  She is feeling well. Pain control is adequate. Voiding without difficulty. Tolerating a regular diet. Ambulating well.  Objective:   Blood pressure 122/79, pulse (!) 59, temperature 98.2 F (36.8 C), temperature source Oral, resp. rate 18, height 5' (1.524 m), weight 94.3 kg, last menstrual period 01/27/2020, SpO2 100 %, unknown if currently breastfeeding.  General: NAD Pulmonary: no increased work of breathing Abdomen: non-distended, non-tender Uterus:  fundus firm at U; lochia normal. Extremities: no edema, no erythema, no tenderness, no signs of DVT  Results for orders placed or performed during the hospital encounter of 01/21/21 (from the past 72 hour(s))  Resp Panel by RT-PCR (Flu A&B, Covid) Nasopharyngeal Swab     Status: None   Collection Time: 01/21/21  3:26 PM   Specimen: Nasopharyngeal Swab; Nasopharyngeal(NP) swabs in vial transport medium  Result Value Ref Range   SARS Coronavirus 2 by RT PCR NEGATIVE NEGATIVE    Comment: (NOTE) SARS-CoV-2 target nucleic acids are NOT DETECTED.  The SARS-CoV-2 RNA is generally detectable in upper respiratory specimens during the acute phase of infection. The lowest concentration of SARS-CoV-2 viral copies this assay can detect is 138 copies/mL. A negative result does not preclude SARS-Cov-2 infection and should not be used as the sole basis for treatment or other patient management decisions. A negative result may occur with  improper specimen collection/handling, submission of specimen other than nasopharyngeal swab, presence of viral mutation(s) within the areas targeted by this assay, and inadequate number of viral copies(<138 copies/mL). A negative result must be combined with clinical observations, patient history, and epidemiological information. The expected result is Negative.  Fact Sheet for Patients:  BloggerCourse.com  Fact Sheet for Healthcare Providers:   SeriousBroker.it  This test is no t yet approved or cleared by the [place] FDA and  has been authorized for detection and/or diagnosis of SARS-CoV-2 by FDA under an Emergency Use Authorization (EUA). This EUA will remain  in effect (meaning this test can be used) for the duration of the COVID-19 declaration under Section 564(b)(1) of the Act, 21 U.S.C.section 360bbb-3(b)(1), unless the authorization is terminated  or revoked sooner.       Influenza A by PCR NEGATIVE NEGATIVE   Influenza B by PCR NEGATIVE NEGATIVE    Comment: (NOTE) The Xpert Xpress SARS-CoV-2/FLU/RSV plus assay is intended as an aid in the diagnosis of influenza from Nasopharyngeal swab specimens and should not be used as a sole basis for treatment. Nasal washings and aspirates are unacceptable for Xpert Xpress SARS-CoV-2/FLU/RSV testing.  Fact Sheet for Patients: BloggerCourse.com  Fact Sheet for Healthcare Providers: SeriousBroker.it  This test is not yet approved or cleared by the [place] FDA and has been authorized for detection and/or diagnosis of SARS-CoV-2 by FDA under an Emergency Use Authorization (EUA). This EUA will remain in effect (meaning this test can be used) for the duration of the COVID-19 declaration under Section 564(b)(1) of the Act, 21 U.S.C. section 360bbb-3(b)(1), unless the authorization is terminated or revoked.  Performed at [facility], [address] [place], [place] [address]   CBC     Status: Abnormal   Collection Time: 01/21/21  4:01 PM  Result Value Ref Range   WBC 6.9 4.0 - 10.5 K/uL   RBC 4.48 3.87 - 5.11 MIL/uL   Hemoglobin 11.6 (L) 12.0 - 15.0 g/dL   HCT 58.0 (L) 99.8 - 33.8 %   MCV 77.0 (L) 80.0 - 100.0 fL   MCH 25.9 (L) 26.0 - 34.0  pg   MCHC 33.6 30.0 - 36.0 g/dL   RDW 93.2 67.1 - 24.5 %   Platelets 324 150 - 400 K/uL   nRBC 0.0 0.0 - 0.2 %    Comment: Performed  at [facility], [address] [place], [place] [address]  Type and screen [facility]     Status: None   Collection Time: 01/21/21  4:01 PM  Result Value Ref Range   ABO/RH(D) B POS    Antibody Screen NEG    Sample Expiration      01/24/2021,2359 Performed at [facility], [address] [place], [place] [address]   RPR     Status: None   Collection Time: 01/21/21  4:01 PM  Result Value Ref Range   RPR Ser Ql NON REACTIVE NON REACTIVE    Comment: Performed at [facility], [address] [place], [place] [address]  Glucose, capillary     Status: None   Collection Time: 01/21/21  4:08 PM  Result Value Ref Range   Glucose-Capillary 73 70 - 99 mg/dL    Comment: Glucose reference range applies only to samples taken after fasting for at least 8 hours.  CBC     Status: Abnormal   Collection Time: 01/22/21  5:28 AM  Result Value Ref Range   WBC 9.2 4.0 - 10.5 K/uL   RBC 3.85 (L) 3.87 - 5.11 MIL/uL   Hemoglobin 10.0 (L) 12.0 - 15.0 g/dL   HCT 67.3 (L) 41.9 - 37.9 %   MCV 78.4 (L) 80.0 - 100.0 fL   MCH 26.0 26.0 - 34.0 pg   MCHC 33.1 30.0 - 36.0 g/dL   RDW 02.4 09.7 - 35.3 %   Platelets 247 150 - 400 K/uL   nRBC 0.0 0.0 - 0.2 %    Comment: Performed at [facility], [address] [place], [place] [address]  Glucose, capillary     Status: Abnormal   Collection Time: 01/22/21 11:38 AM  Result Value Ref Range   Glucose-Capillary 117 (H) 70 - 99 mg/dL    Comment: Glucose reference range applies only to samples taken after fasting for at least 8 hours.  Glucose, capillary     Status: Abnormal   Collection Time: 01/22/21  9:15 PM  Result Value Ref Range   Glucose-Capillary 125 (H) 70 - 99 mg/dL    Comment: Glucose reference range applies only to samples taken after fasting for at least 8 hours.  Glucose, capillary     Status: None   Collection Time: 01/23/21  8:21 AM  Result Value Ref Range   Glucose-Capillary 88 70 - 99  mg/dL    Comment: Glucose reference range applies only to samples taken after fasting for at least 8 hours.    Assessment:   35 y.o. [id number] postpartum day # 2  Plan:   1) Acute blood loss anemia - hemodynamically stable and asymptomatic - po ferrous sulfate  2) Blood Type --/--/B POS (05/13 1601)   3) Rubella   / Varicella status unknown  4) TDAP status - needed, ordered Immunization History  Administered Date(s) Administered  . Influenza-Unspecified 07/26/2020    5) Feeding- bottle/breast- has been successful with obtaining colostrum while pumpong.    6) Contraception - OCP- plans to start at 6 weeks postpartum, plan to follow up with health department  7) Disposition - discharge home today.   8) T2DM- blood glucose reasonably well controlled postpartum.  Adelene Idler MD  [facility], [facility] 01/23/2021 11:05 AM

## 2021-01-23 NOTE — Lactation Note (Signed)
This note was copied from a baby's chart. Lactation Consultation Note  Patient Name: Nicole Livingston ZLDJT'T Date: 01/23/2021 Reason for consult: Follow-up assessment;Mother's request;Term;Other (Comment) Nicole Livingston is sleepy at the breast and mom supplementing with formula) Age:33 hours  Maternal Data Has patient been taught Hand Expression?: Yes Does the patient have breastfeeding experience prior to this delivery?: Yes How long did the patient breastfeed?: Breast fed last 2 for 2 years  Feeding Mother's Current Feeding Choice: Breast Milk and Formula  Mom concerned that Nicole Livingston is falling asleep at the breast and is not getting enough so is still supplementing with formula.  Explained again the need for frequent stimulation of the breast preferably by putting her to the breast or pumping to bring in the mature milk which will be larger volumes.  Demonstrated that she had lots of colostrum when hand expressed.  Mom requesting information on how to increase her milk supply.  Hand outs given on ways to increase supply if needed if struggled with supply when mature milk transitioned in such as frequent breast feeding, warmth, massage, skin to skin, lactogenic foods and drinks, herbal galactagogues, power pumping, super pumping, etc  Mom and baby will be discharged soon.  All of mom's lactation questions addressed and contact numbers reviewed if she had any further questions, concerns or needed assistance. LATCH Score Latch: Repeated attempts needed to sustain latch, nipple held in mouth throughout feeding, stimulation needed to elicit sucking reflex.  Audible Swallowing: A few with stimulation  Type of Nipple: Everted at rest and after stimulation  Comfort (Breast/Nipple): Filling, red/small blisters or bruises, mild/mod discomfort  Hold (Positioning): No assistance needed to correctly position infant at breast.  LATCH Score: 7   Lactation Tools Discussed/Used Tools: Pump;Coconut oil Breast  pump type: Double-Electric Breast Pump Pump Education: Setup, frequency, and cleaning;Milk Storage;Other (comment) Reason for Pumping: Mom wants to do breast and bottle Pumping frequency: Encouraged mom to pump every time she gives a bottle for stimulation to bring in mature milk, ensure a plentiful milk supply and prevent engorgement Pumped volume:  (Still only getting drops, but feeling fuller after started pumping)  Interventions Interventions: Breast feeding basics reviewed;Breast massage;Hand express;Pre-pump if needed;Breast compression;Support pillows;Position options;Expressed milk;Coconut oil;DEBP;Hand pump;Education  Discharge Discharge Education: Engorgement and breast care;Warning signs for feeding baby;Other (comment)  Consult Status Consult Status: PRN    Louis Meckel 01/23/2021, 7:22 PM

## 2021-01-23 NOTE — Anesthesia Postprocedure Evaluation (Signed)
Anesthesia Post Note  Patient: Nicole Livingston  Procedure(s) Performed: AN AD HOC LABOR EPIDURAL  Patient location during evaluation: Mother Baby Anesthesia Type: Epidural Level of consciousness: awake and alert Pain management: pain level controlled Vital Signs Assessment: post-procedure vital signs reviewed and stable Respiratory status: spontaneous breathing, nonlabored ventilation and respiratory function stable Cardiovascular status: stable Postop Assessment: no headache, no backache and epidural receding Anesthetic complications: no   No complications documented.   Last Vitals:  Vitals:   01/22/21 2302 01/23/21 0743  BP: 118/84 122/79  Pulse: 71 (!) 59  Resp: 18 18  Temp: 36.8 C 36.8 C  SpO2: 99% 100%    Last Pain:  Vitals:   01/23/21 1046  TempSrc:   PainSc: 6                  Yevette Edwards

## 2021-01-23 NOTE — Progress Notes (Signed)
Patient discharged home with infant. Discharge instructions, prescriptions and follow up appointment given to and reviewed with patient. Patient verbalized understanding. Pt wheeled out with infant

## 2021-02-26 IMAGING — US US OB COMP +14 WK
1 series · 15 of 28 positions shown · non-contrast
Comparison: none

CLINICAL DATA: 34-year-old pregnant female with uncertain dates,
presenting for fetal anatomic survey.

EXAM:
OBSTETRICAL ULTRASOUND >14 WKS

[Series 1: us ob comp + 14 wk · 15 of 75 slices shown]
[im 1/75]
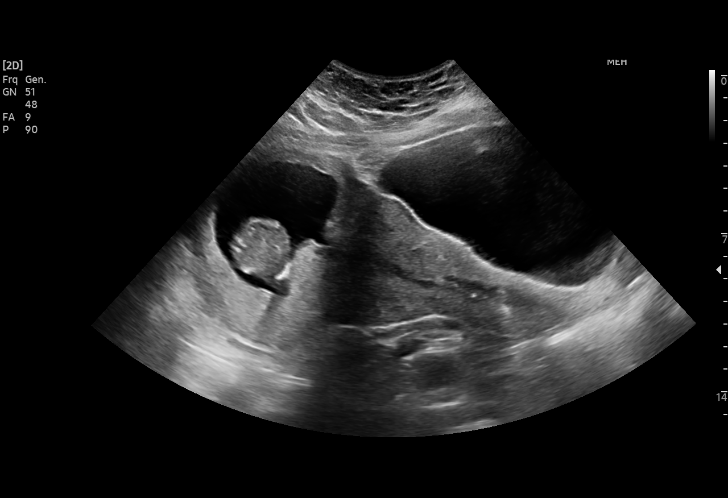
[im 6/75]
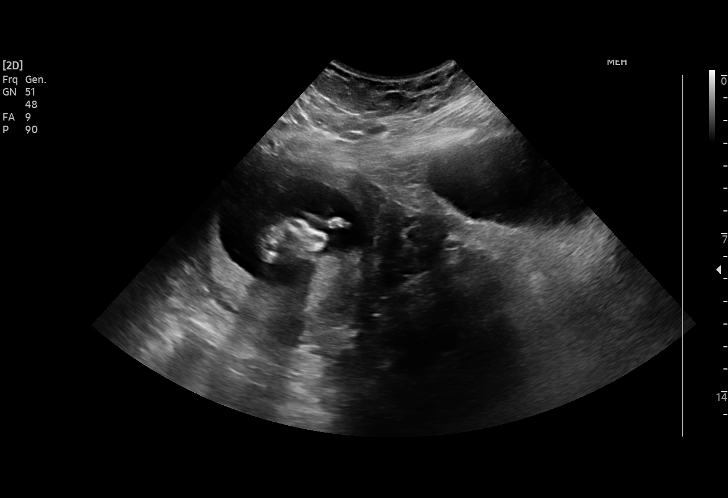
[im 11/75]
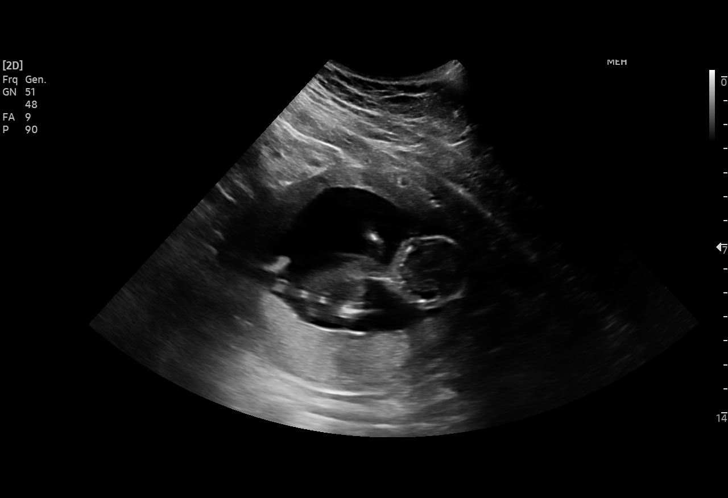
[im 17/75]
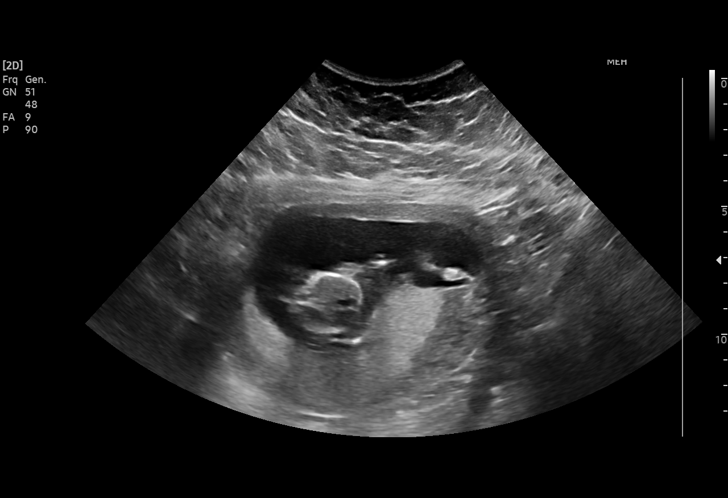
[im 22/75]
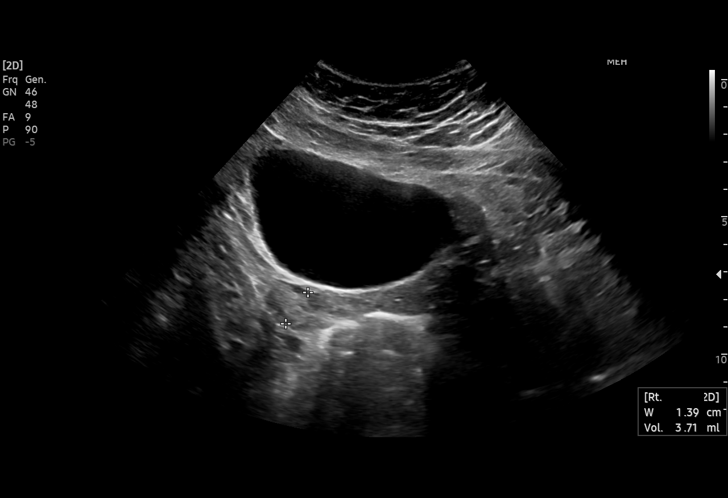
[im 28/75]
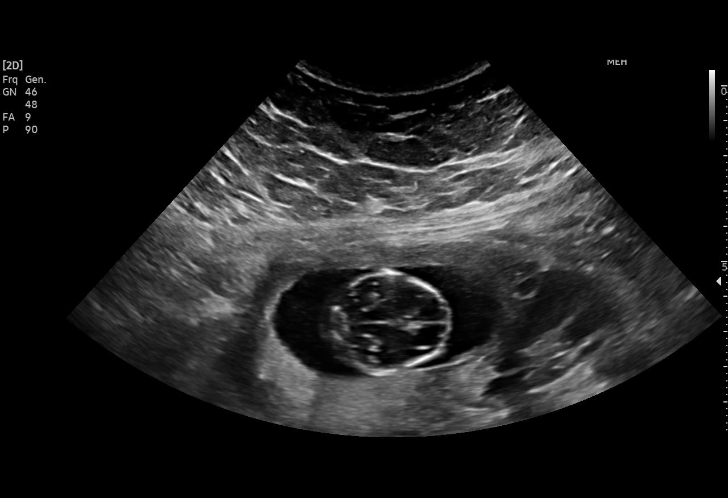
[im 33/75]
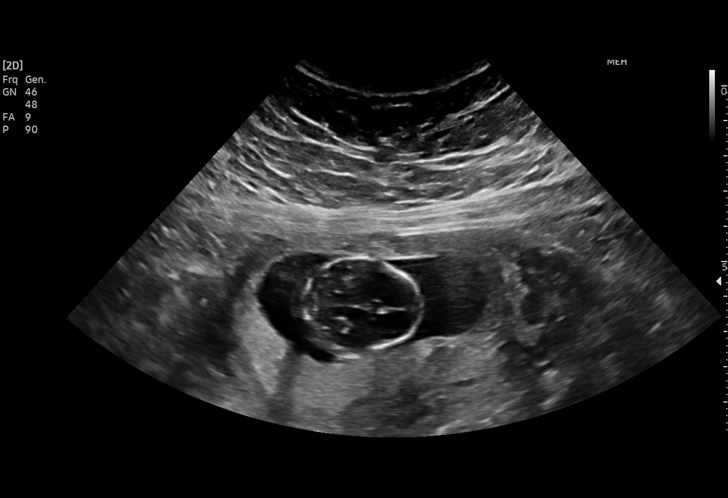
[im 39/75]
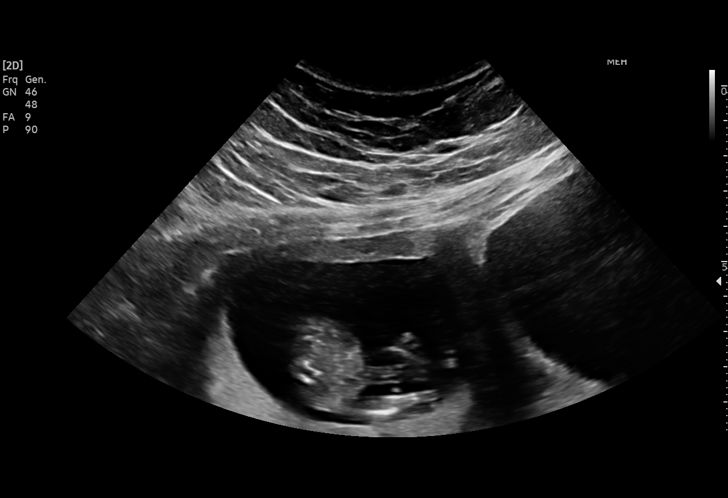
[im 42/75]
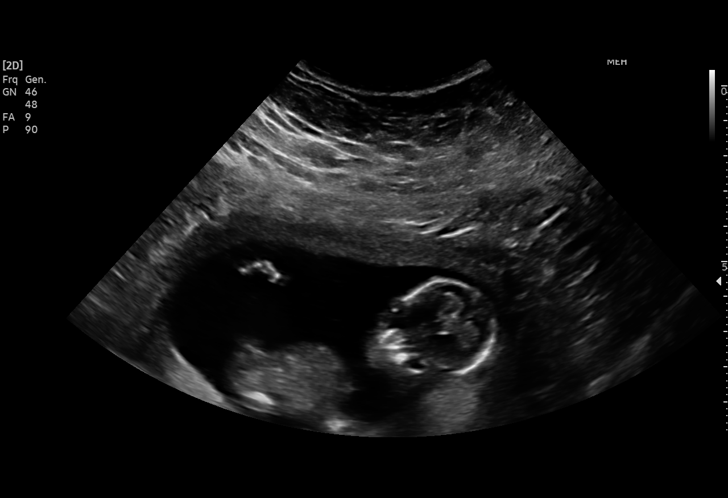
[im 47/75]
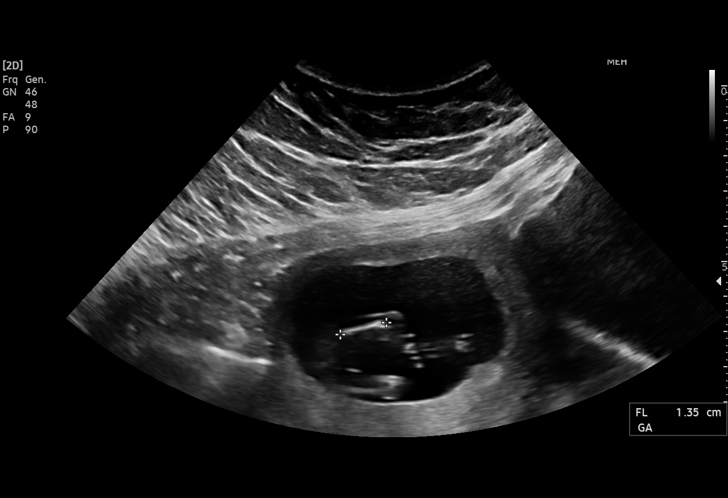
[im 53/75]
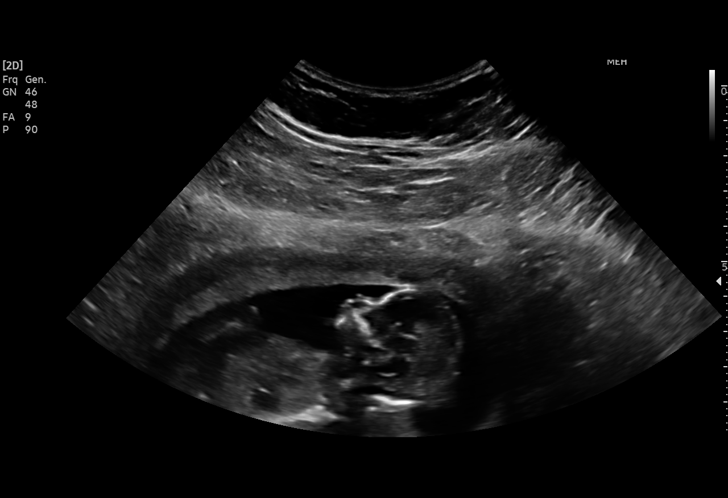
[im 58/75]
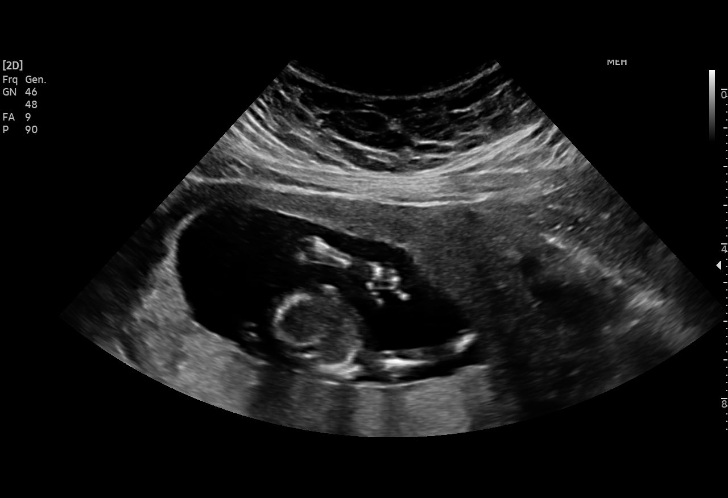
[im 64/75]
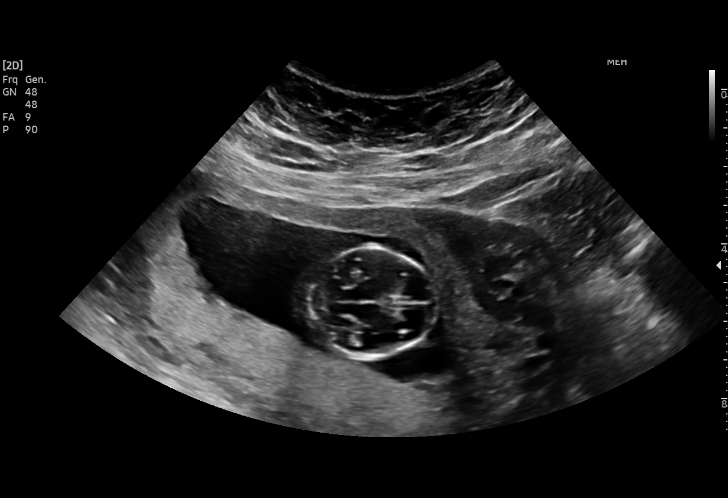
[im 69/75]
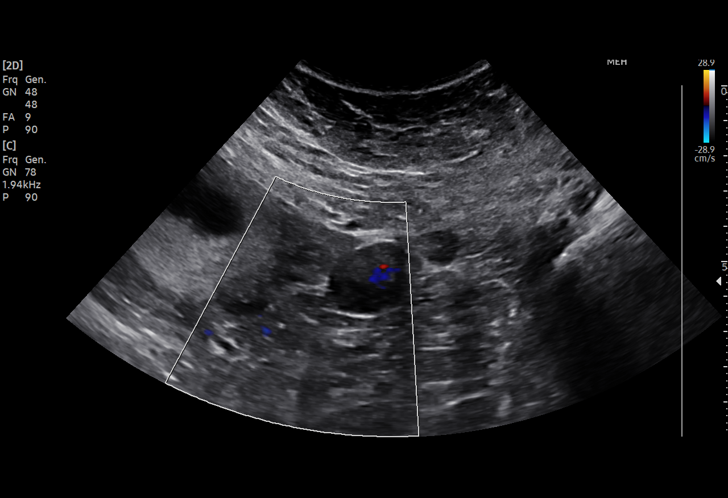
[im 75/75]
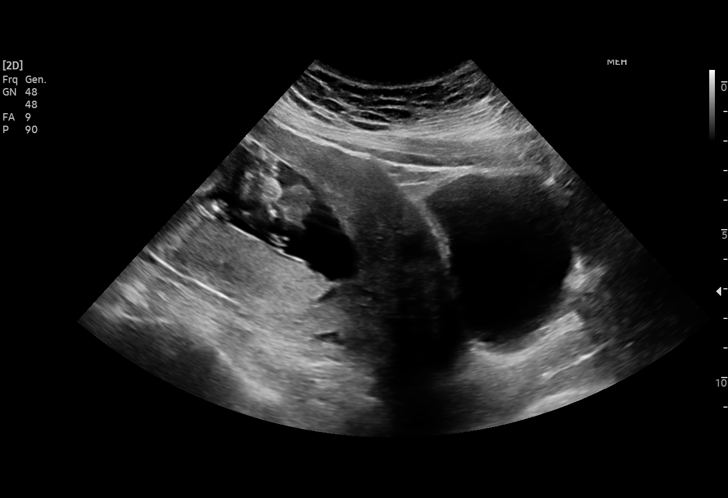

[15 of 28 positions shown; findings below may reference images not displayed]

FINDINGS: Number of Fetuses: 1

Heart Rate:  160 bpm

Movement: Yes

Presentation: Transverse with fetal head on maternal left

Previa: Low lying developing placenta.

Placental Location: Posterior

Amniotic Fluid (Subjective): Normal

Amniotic Fluid (Objective):

Vertical pocket = 4.0cm

FETAL BIOMETRY

BPD: 2.8cm 15w 0d

HC:   10.6cm 15w 0d

AC:   8.6cm 14w 6d

FL:   1.4cm 14w 0d

Current Mean GA: 14w 5d US EDC: 01/20/2021

Estimated Fetal Weight:  99g

FETAL ANATOMY

Lateral Ventricles: Appears normal

Thalami/CSP: Appears normal

Posterior Fossa:  Appears normal

Nuchal Region: Appears normal   NFT= 4.2 mm

Upper Lip: Not visualized

Spine: Not visualized

4 Chamber Heart on Left: Not well visualized

LVOT: Not visualized

RVOT: Not visualized

Stomach on Left: Appears normal

3 Vessel Cord: Not visualized

Cord Insertion site: Appears normal

Kidneys: Not visualized

Bladder: Appears normal

Extremities: Appears normal, 4 extremities demonstrated

Sex: Not Visualized

Technically difficult due to: Early gestational age

Maternal Findings:

Cervix:  No evidence of internal cervical funneling.
IMPRESSION: 1. Single living intrauterine gestation at 14 weeks 5 days by
average ultrasound age.
2. Fetal anatomic survey significantly limited by early gestational
age. No fetal abnormalities detected. Patient should return for
completion of fetal anatomic survey in 4-6 weeks.
3. Low lying posterior developing placenta, which can also be
reassessed on follow-up scan in 4-6 weeks. Normal amniotic fluid
volume.
